# Patient Record
Sex: Female | Born: 1965 | Race: Asian | Hispanic: No | Marital: Married | Smoking: Never smoker
Health system: Southern US, Community
[De-identification: ages and names within clinical notes are randomized; demographics above are authoritative.]

## PROBLEM LIST (undated history)

## (undated) DIAGNOSIS — F3289 Other specified depressive episodes: Secondary | ICD-10-CM

## (undated) DIAGNOSIS — M436 Torticollis: Secondary | ICD-10-CM

## (undated) DIAGNOSIS — E538 Deficiency of other specified B group vitamins: Secondary | ICD-10-CM

## (undated) DIAGNOSIS — R7989 Other specified abnormal findings of blood chemistry: Secondary | ICD-10-CM

## (undated) DIAGNOSIS — M25559 Pain in unspecified hip: Secondary | ICD-10-CM

## (undated) DIAGNOSIS — E785 Hyperlipidemia, unspecified: Secondary | ICD-10-CM

## (undated) DIAGNOSIS — F329 Major depressive disorder, single episode, unspecified: Secondary | ICD-10-CM

## (undated) DIAGNOSIS — B351 Tinea unguium: Secondary | ICD-10-CM

## (undated) HISTORY — PX: APPENDECTOMY: SHX54

## (undated) HISTORY — PX: BREAST LUMPECTOMY: SHX2

## (undated) HISTORY — DX: Other specified depressive episodes: F32.89

## (undated) HISTORY — DX: Other specified abnormal findings of blood chemistry: R79.89

## (undated) HISTORY — DX: Tinea unguium: B35.1

## (undated) HISTORY — DX: Major depressive disorder, single episode, unspecified: F32.9

## (undated) HISTORY — DX: Deficiency of other specified B group vitamins: E53.8

## (undated) HISTORY — DX: Torticollis: M43.6

## (undated) HISTORY — DX: Pain in unspecified hip: M25.559

## (undated) HISTORY — DX: Hyperlipidemia, unspecified: E78.5

---

## 1997-11-14 ENCOUNTER — Other Ambulatory Visit: Admission: RE | Admit: 1997-11-14 | Discharge: 1997-11-14 | Payer: Self-pay | Admitting: Gynecology

## 1997-12-07 ENCOUNTER — Inpatient Hospital Stay (HOSPITAL_COMMUNITY): Admission: AD | Admit: 1997-12-07 | Discharge: 1997-12-07 | Payer: Self-pay | Admitting: Gynecology

## 1997-12-12 ENCOUNTER — Inpatient Hospital Stay (HOSPITAL_COMMUNITY): Admission: AD | Admit: 1997-12-12 | Discharge: 1997-12-14 | Payer: Self-pay | Admitting: Gynecology

## 1997-12-16 ENCOUNTER — Inpatient Hospital Stay (HOSPITAL_COMMUNITY): Admission: AD | Admit: 1997-12-16 | Discharge: 1997-12-18 | Payer: Self-pay | Admitting: Obstetrics and Gynecology

## 1997-12-18 ENCOUNTER — Encounter: Admission: RE | Admit: 1997-12-18 | Discharge: 1998-03-18 | Payer: Self-pay | Admitting: Obstetrics and Gynecology

## 1998-02-17 ENCOUNTER — Other Ambulatory Visit: Admission: RE | Admit: 1998-02-17 | Discharge: 1998-02-17 | Payer: Self-pay | Admitting: Gynecology

## 2000-10-04 ENCOUNTER — Ambulatory Visit (HOSPITAL_BASED_OUTPATIENT_CLINIC_OR_DEPARTMENT_OTHER): Admission: RE | Admit: 2000-10-04 | Discharge: 2000-10-04 | Payer: Self-pay | Admitting: General Surgery

## 2000-10-04 ENCOUNTER — Encounter (INDEPENDENT_AMBULATORY_CARE_PROVIDER_SITE_OTHER): Payer: Self-pay | Admitting: *Deleted

## 2003-11-06 ENCOUNTER — Other Ambulatory Visit: Admission: RE | Admit: 2003-11-06 | Discharge: 2003-11-06 | Payer: Self-pay | Admitting: Family Medicine

## 2007-03-08 ENCOUNTER — Other Ambulatory Visit: Admission: RE | Admit: 2007-03-08 | Discharge: 2007-03-08 | Payer: Self-pay | Admitting: Family Medicine

## 2007-09-27 ENCOUNTER — Ambulatory Visit (HOSPITAL_BASED_OUTPATIENT_CLINIC_OR_DEPARTMENT_OTHER): Admission: RE | Admit: 2007-09-27 | Discharge: 2007-09-27 | Payer: Self-pay | Admitting: Orthopedic Surgery

## 2010-08-13 ENCOUNTER — Other Ambulatory Visit
Admission: RE | Admit: 2010-08-13 | Discharge: 2010-08-13 | Payer: Self-pay | Source: Home / Self Care | Admitting: Family Medicine

## 2010-08-29 ENCOUNTER — Encounter: Payer: Self-pay | Admitting: Family Medicine

## 2010-12-21 NOTE — Op Note (Signed)
Carolyn Kirk, Carolyn Kirk               ACCOUNT NO.:  [id number]   MEDICAL RECORD NO.:  [id number]          PATIENT TYPE:  AMB   LOCATION:  DSC                          FACILITY:  [facility]   PHYSICIAN:  Katy Fitch. Sypher, M.D. DATE OF BIRTH:  [birth year]   DATE OF PROCEDURE:  09/27/2007  DATE OF DISCHARGE:                               OPERATIVE REPORT   PREOPERATIVE DIAGNOSIS:  A 73-month history of painful adhesive  capsulitis right shoulder, unresponsive to therapy, antiinflammatory  medication, steroid injection, with magnetic resonance imaging evidence  of thickened rotator interval, and swollen capsule.   POSTOPERATIVE DIAGNOSIS:  A 60-month history of painful adhesive  capsulitis right shoulder, unresponsive to therapy, antiinflammatory  medication, steroid injection, with magnetic resonance imaging evidence  of thickened rotator interval, and swollen capsule.   OPERATIONS:  1. Examination of right shoulder under anesthesia, confirming      significant adhesive capsulitis, followed by gentle manipulation      and improvement of range of motion from combined elevation 140-175,      external rotation at 90 degrees abduction of 60-90 degrees,      internal rotation of 10 degrees at 90 degrees, abduction to 60      degrees.  2. Diagnostic arthroscopy right shoulder, with arthroscopic      debridement of granulation tissue, followed by capsulectomy and      tenolysis of subscapularis tendon.  3. Arthroscopic subacromial decompression, with debridement of      fibrotic bursal adhesions and electrocautery of granulation tissue.   OPERATING SURGEON:  Katy Fitch. Sypher, M.D.   ASSISTANT:  Marveen Reeks. Dasnoit PA-C.   ANESTHESIA:  General by endotracheal technique.  No interscalene block  was placed preoperatively.   SUPERVISING ANESTHESIOLOGIST:  Quita Skye. Krista Blue, M.D.   INDICATIONS:  Carolyn Kirk is a 45 year old employee of [employer].  She presented for evaluation of a chronic arm  and shoulder  pain predicament.  She was initially evaluated and managed by my  associate, Dr. Merlyn Lot.  Her elbow pain improved with therapy.  Her  shoulder pain persisted.  Despite nearly 6 months of therapy and  antiinflammatory medications, she has had persistent pain, difficulty  sleeping at night, work impairment, and daily activity impairment.  Dr.  Merlyn Lot referred her for an upper extremity orthopedic shoulder consult.   Clinical examination confirmed significant adhesive capsulitis.  Her  plain films and MRI were reviewed.  Her plain films were essentially  normal.  The MRI revealed a swollen rotator interval, thickening of the  capsule, no sign of biceps pathology, significant labral pathology, or  rotator cuff impairment.   We advised [name] to consider a more aggressive approach, including  manipulation of the shoulder under anesthesia, followed by debridement  of granulation tissue, capsulectomy, and electrocautery.  After informed  consent, she is brought to the operating room at this time.   Preoperatively, she was advised by Dr. Krista Blue in an anesthesia consult  to consider an interscalene block.  She chose to defer the block.   We will begin immediate active range of motion excises in  the  postoperative period and anticipate that she will return for supervised  therapy with in 24 hours.   Questions were invited and answered in detail in the holding area.   PROCEDURE:  Carolyn Kirk was brought to the operating room and placed  in a supine position upon the operating table.  Following induction of  general anesthesia by endotracheal technique under Dr. Robina Ade direct  supervision, she was carefully positioned in the beach-chair position  with aid of a torso and head holders designed for shoulder arthroscopy.  Examination of the shoulder under anesthesia confirmed significant  adhesive capsulitis that was improved by gentle manipulation to the  aforementioned  parameters.  The right arm was then prepped with Betadine  soap and solution and sterilely draped.  Pneumatic compression devices  were applied to her cast for deep vein thrombosis prophylaxis.   After application of impervious arthroscopy drapes, the scope was placed  through a standard posterior portal utilizing a preliminary switching  stick from an anterior approach technique.  The shoulder was  instrumented without difficulty, followed by clearing of clot and  debris.  Photographic documentation of the adhesive capsulitis  granulation tissue was accomplished, followed by use of a 4.5 mm suction  shaver to debride all visible granulations from the interior of the  joint, followed by use of a bipolar cautery device to electrocauterized  the feeding vessels.  The subscapularis was invested in dense scar,  which was meticulously cleared and electrocauterized.  The anterior  capsule was somewhat adherent to the anterior surface of the  subscapularis.  This was lysed with a Psychologist, occupational.  After completion of the intraarticular debridement, the  rotator cuff was inspected and found be intact.  The biceps origin was  stable.  The biceps tendon was normal through the rotator interval, and  all rotator cuff insertions were noted to be normal.  Inferior capsule  was inflamed but otherwise normal.  The scope was removed from the  glenohumeral joint and placed in the subacromial space.  We were  challenged to visualize the subacromial space due to dense adhesions.  We ultimately instrumented through a lateral approach and began shaving  posteriorly.  After completion of a thorough bursectomy, the anatomy of  the coracoacromial arch was evaluated.  [facility] joint was normal.  The  coracoacromial ligament was normal.  This was lysed from dense bursal  adhesions.  The rotator cuff was tenolysed from the superior portion of  the subscapularis all the way across the dorsum of  the supraspinatus and  infraspinatus towards the teres minor.  Hemostasis was achieved with the  bipolar cautery.  Thereafter, the subacromial space was infiltrated with  2% lidocaine, followed by infiltration of the portals with 2% lidocaine.  No intraarticular local anesthetic was placed.   Ms. Nease was awakened from general anesthesia and transferred to the  recovery room with stable vital signs.  She will be discharged to the  care of her husband, with prescriptions for Dilaudid 2 mg 1-2 tablets  p.o. q.4-6 h. p.r.n. pain, 30 tablets, without refill; also Motrin 600  mg 1 p.o. q.6 h. p.r.n. pain, 30 tablets, with 1 refill; and Keflex 500  mg 1 p.o. q.8 h. x4 days as a prophylactic antibiotic.      Katy Fitch Sypher, M.D.  Electronically Signed     RVS/MEDQ  D:  09/27/2007  T:  09/28/2007  Job:  [id number]

## 2010-12-24 NOTE — Op Note (Signed)
[facility]  Patient:    HENCHY, MCCAULEY                        MRN: [id number] Proc. Date: 10/04/00 Attending:  Anselm Pancoast. Zachery Dakins, M.D.                           Operative Report  PREOPERATIVE DIAGNOSIS:  Left breast mass.  POSTOPERATIVE DIAGNOSIS:  Left breast mass.  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  SURGEON:  Anselm Pancoast. Zachery Dakins, M.D.  ANESTHESIA:  General.  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  She has no family history of breast cancer.  She has had one previous pregnancy and on physical exam with deep palpation, you could feel a little thickening right at approximately the areolar edge approximately 3 oclock position. I confirmed this with the ultrasound in our office and this was the area of question.  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  She wanted the area excised and therefore we plan that procedure today.  DESCRIPTION OF PROCEDURE:  The patient was taken to an operating suite.  She was given a gram of Kefzol, induction of general anesthesia and LMA tube.  The breasts were prepped with Betadine solution and draped in a sterile manner.  I thought I could probably feel the area.  The ultrasound showed this about 3 cm down into the breast and a long areolar incision was made from approximately the 2 to 4 oclock position and then with a little incision about an inch in depth your finger then you could feel the underlying mass that was firm right where we were making the incision.  This area then dissected further and I placed a 4-0 Vicryl stitch in the actual lump with pulled it up and then  the surrounding breast tissue was separated with electrocautery.  The mass is little over 1 cm in size is quite firm and was sent for frozen section examination after it was excised.  Bleeders were controlled with cautery.  The little defect was closed with a couple of 4-0 Vicryl stitches in the true deep breast tissue and a couple in the subdermal tissue.  The incision was closed with benzoin and Steri-Strips and a little light dressing was applied.  The patient was awakened and sent to the recovery room in stable postoperative condition and we will await path examination.  The patient will be released after a short stay and return to the office in approximately a week. DD:  10/04/00 TD:  10/04/00 Job: [id number]

## 2011-05-02 LAB — POCT HEMOGLOBIN-HEMACUE: Hemoglobin: 12.4

## 2013-09-16 ENCOUNTER — Other Ambulatory Visit (HOSPITAL_COMMUNITY)
Admission: RE | Admit: 2013-09-16 | Discharge: 2013-09-16 | Disposition: A | Discharge status: Home / Self Care | Payer: BC Managed Care – PPO | Source: Ambulatory Visit | Attending: Family Medicine | Admitting: Family Medicine

## 2013-09-16 ENCOUNTER — Other Ambulatory Visit: Payer: Self-pay | Admitting: Family Medicine

## 2013-09-16 DIAGNOSIS — Z124 Encounter for screening for malignant neoplasm of cervix: Secondary | ICD-10-CM | POA: Insufficient documentation

## 2013-09-16 DIAGNOSIS — Z1151 Encounter for screening for human papillomavirus (HPV): Secondary | ICD-10-CM | POA: Insufficient documentation

## 2013-10-08 ENCOUNTER — Encounter: Payer: Self-pay | Admitting: *Deleted

## 2013-10-09 ENCOUNTER — Ambulatory Visit: Payer: Self-pay | Admitting: Cardiology

## 2013-10-17 ENCOUNTER — Ambulatory Visit (INDEPENDENT_AMBULATORY_CARE_PROVIDER_SITE_OTHER): Payer: BC Managed Care – PPO | Admitting: Cardiology

## 2013-10-17 ENCOUNTER — Encounter: Payer: Self-pay | Admitting: Cardiology

## 2013-10-17 VITALS — BP 114/58 | HR 68 | Ht 63.5 in | Wt 151.0 lb

## 2013-10-17 DIAGNOSIS — E785 Hyperlipidemia, unspecified: Secondary | ICD-10-CM | POA: Insufficient documentation

## 2013-10-17 DIAGNOSIS — R079 Chest pain, unspecified: Secondary | ICD-10-CM

## 2013-10-17 MED ORDER — NITROGLYCERIN 0.4 MG SL SUBL: 0.4000 mg | SUBLINGUAL_TABLET | SUBLINGUAL | Status: DC | PRN

## 2013-10-17 NOTE — Progress Notes (Signed)
Patient ID: Carolyn Kirk, female   DOB: 1966-06-11, 48 y.o.   MRN: [id number]    Patient Name: Carolyn Kirk Date of Encounter: 10/17/2013  Primary Care Provider:  No primary provider on file. Primary Cardiologist:  Dorothy Spark  Problem List   Past Medical History  Diagnosis Date  . Other and unspecified hyperlipidemia   . Low vitamin B12 level   . Onychomycosis     of great toenail  . Torticollis, unspecified   . Pain in joint, pelvic region and thigh   . Depressive disorder, not elsewhere classified    Past Surgical History  Procedure Laterality Date  . Breast lumpectomy Left   . Appendectomy      Allergies  No Known Allergies  HPI  Pleasant 48 year old female who is coming with concerns of chest pain. 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 There retrosternal, they feel like elephant sitting on her chest, irradiated to her back and are associated with emotional stress rather than exercise activity. It can last up to 30 minutes. Beer sometimes so severe that she has to pull over when a car while she's driving.  She states that she exercises for certain minutes on a treadmill daily and she doesn't experience any chest pain or shortness of breath. There is no associated dizziness, diaphoresis or shortness of breath. She is advised fairly active and denies dyspnea on exertion. She also denies orthopnea, paroxysmal nocturnal dyspnea lower extremity edema.  The patient has been followed for hyperlipidemia by her primary care physician and she is currently on simvastatin 40 mg tablets that she takes every other day. She states that her cholesterol has been well controlled. She has very significant family history of coronary artery disease her mother died of heart attack and sudden cardiac death at age of 22, this was preceded by strokes. She also had atrial fibrillation. Her 80 year old brother was just diagnosed with atrial fibrillation. Multiple times  also suffered from stroke at a very young age.  Home Medications  Prior to Admission medications   Medication Sig Start Date End Date Taking? Authorizing Provider  CALCIUM PO Take by mouth.    Historical Provider, MD  Cholecalciferol (VITAMIN D PO) Take by mouth.    Historical Provider, MD  simvastatin (ZOCOR) 40 MG tablet Take 40 mg by mouth daily.    Historical Provider, MD    Family History  Family History  Problem Relation Age of Onset  . CVA Mother     3x  . Testicular cancer Father   . Esophageal cancer Father   . Diabetes Father   . Hyperlipidemia Brother   . Diabetes Mellitus II Sister   . Thyroid disease Sister     Social History  History   Social History  . Marital Status: Married    Spouse Name: N/A    Number of Children: N/A  . Years of Education: N/A   Occupational History  . Not on file.   Social History Main Topics  . Smoking status: Never Smoker   . Smokeless tobacco: Never Used  . Alcohol Use: No  . Drug Use: No  . Sexual Activity: Yes   Other Topics Concern  . Not on file   Social History Narrative  . No narrative on file     Review of Systems, as per HPI, otherwise negative General:  No chills, fever, night sweats or weight changes.  Cardiovascular:  No chest pain, dyspnea on exertion, edema, orthopnea,  palpitations, paroxysmal nocturnal dyspnea. Dermatological: No rash, lesions/masses Respiratory: No cough, dyspnea Urologic: No hematuria, dysuria Abdominal:   No nausea, vomiting, diarrhea, bright red blood per rectum, melena, or hematemesis Neurologic:  No visual changes, wkns, changes in mental status. All other systems reviewed and are otherwise negative except as noted above.  Physical Exam  Blood pressure 114/58, pulse 68, height 5' 3.5" (1.613 m), weight 151 lb (68.493 kg).  General: Pleasant, NAD Psych: Normal affect. Neuro: Alert and oriented X 3. Moves all extremities spontaneously. HEENT: Normal  Neck: Supple without  bruits or JVD. Lungs:  Resp regular and unlabored, CTA. Heart: RRR no s3, s4, or murmurs. Abdomen: Soft, non-tender, non-distended, BS + x 4.  Extremities: No clubbing, cyanosis or edema. DP/PT/Radials 2+ and equal bilaterally.  Labs: Creatinine 0.63 AST 13 ALT 10 Normal been met LDL 101 HDL 61 Triglycerides 196  Accessory Clinical Findings  Echocardiogram - none  ECG - normal sinus rhythm, negative T waves in leads 2, 3 aVF, V3 and V4 5.   Assessment & Plan  48 year old female with a history of hyperlipidemia and significant family history of coronary artery disease  1. Chest pain - typical in current therapy nonexertional, related to emotional stress. She had an episode of chest pain in her office today where she had negative T waves in inferolateral leads supposed to normal EKG at her doctor's office. We'll order an exercise nuclear stress test to evaluate for ischemia. If this comes back negative we'll consider coronary CT to evaluate for Prinzmetal angina or myocardial bridging. We'll prescribe her sublingual nitroglycerin when necessary.  2. Hyperlipidemia by her primary care physician currently on simvastatin 40 mg every other day  3. Blood pressure - well controlled  Follow up in 1 month.  Dorothy Spark, MD, [facility] 10/17/2013, 2:43 PM

## 2013-10-17 NOTE — Patient Instructions (Signed)
Your physician has requested that you have en exercise stress myoview. For further information please visit [email or web address]. Please follow instruction sheet, as given.  Your physician has recommended you make the following change in your medication:   1. Start Nitroglycerin as needed for Chest Pain.  Your physician recommends that you schedule a follow-up appointment after stress test.   Nitroglycerin sublingual tablets What is this medicine? NITROGLYCERIN (nye troe GLI ser in) is a type of vasodilator. It relaxes blood vessels, increasing the blood and oxygen supply to your heart. This medicine is used to relieve chest pain caused by angina. It is also used to prevent chest pain before activities like climbing stairs, going outdoors in cold weather, or sexual activity. This medicine may be used for other purposes; ask your health care provider or pharmacist if you have questions. COMMON BRAND NAME(S): Nitroquick, Nitrostat, Nitrotab What should I tell my health care provider before I take this medicine? They need to know if you have any of these conditions: -anemia -head injury, recent stroke, or bleeding in the brain -liver disease -previous heart attack -an unusual or allergic reaction to nitroglycerin, other medicines, foods, dyes, or preservatives -pregnant or trying to get pregnant -breast-feeding How should I use this medicine? Take this medicine by mouth as needed. At the first sign of an angina attack (chest pain or tightness) place one tablet under your tongue. You can also take this medicine 5 to 10 minutes before an event likely to produce chest pain. Follow the directions on the prescription label. Let the tablet dissolve under the tongue. Do not swallow whole. Replace the dose if you accidentally swallow it. It will help if your mouth is not dry. Saliva around the tablet will help it to dissolve more quickly. Do not eat or drink, smoke or chew tobacco while a tablet is  dissolving. If you are not better within 5 minutes after taking ONE dose of nitroglycerin, call 9-1-1 immediately to seek emergency medical care. Do not take more than 3 nitroglycerin tablets over 15 minutes. If you take this medicine often to relieve symptoms of angina, your doctor or health care professional may provide you with different instructions to manage your symptoms. If symptoms do not go away after following these instructions, it is important to call 9-1-1 immediately. Do not take more than 3 nitroglycerin tablets over 15 minutes. Talk to your pediatrician regarding the use of this medicine in children. Special care may be needed. Overdosage: If you think you have taken too much of this medicine contact a poison control center or emergency room at once. NOTE: This medicine is only for you. Do not share this medicine with others. What if I miss a dose? This does not apply. This medicine is only used as needed. What may interact with this medicine? Do not take this medicine with any of the following medications: -certain migraine medicines like ergotamine and dihydroergotamine (DHE) -medicines used to treat erectile dysfunction like sildenafil, tadalafil, and vardenafil -riociguat This medicine may also interact with the following medications: -alteplase -aspirin -heparin -medicines for high blood pressure -medicines for mental depression -other medicines used to treat angina -phenothiazines like chlorpromazine, mesoridazine, prochlorperazine, thioridazine This list may not describe all possible interactions. Give your health care provider a list of all the medicines, herbs, non-prescription drugs, or dietary supplements you use. Also tell them if you smoke, drink alcohol, or use illegal drugs. Some items may interact with your medicine. What should I watch for while using this  medicine? Tell your doctor or health care professional if you feel your medicine is no longer  working. Keep this medicine with you at all times. Sit or lie down when you take your medicine to prevent falling if you feel dizzy or faint after using it. Try to remain calm. This will help you to feel better faster. If you feel dizzy, take several deep breaths and lie down with your feet propped up, or bend forward with your head resting between your knees. You may get drowsy or dizzy. Do not drive, use machinery, or do anything that needs mental alertness until you know how this drug affects you. Do not stand or sit up quickly, especially if you are an older patient. This reduces the risk of dizzy or fainting spells. Alcohol can make you more drowsy and dizzy. Avoid alcoholic drinks. Do not treat yourself for coughs, colds, or pain while you are taking this medicine without asking your doctor or health care professional for advice. Some ingredients may increase your blood pressure. What side effects may I notice from receiving this medicine? Side effects that you should report to your doctor or health care professional as soon as possible: -blurred vision -dry mouth -skin rash -sweating -the feeling of extreme pressure in the head -unusually weak or tired Side effects that usually do not require medical attention (report to your doctor or health care professional if they continue or are bothersome): -flushing of the face or neck -headache -irregular heartbeat, palpitations -nausea, vomiting This list may not describe all possible side effects. Call your doctor for medical advice about side effects. You may report side effects to FDA at 1-800-FDA-1088. Where should I keep my medicine? Keep out of the reach of children. Store at room temperature between 20 and 25 degrees C (68 and 77 degrees F). Store in Retail buyer. Protect from light and moisture. Keep tightly closed. Throw away any unused medicine after the expiration date. NOTE: This sheet is a summary. It may not cover all possible  information. If you have questions about this medicine, talk to your doctor, pharmacist, or health care provider.  2014, [employer] Standard. (2013-05-16 10:27:26)

## 2013-10-28 ENCOUNTER — Telehealth: Payer: Self-pay | Admitting: Cardiology

## 2013-10-28 NOTE — Telephone Encounter (Signed)
New message  Patient took nitro pill last Monday for chest pain, She is having side effects and would like to discuss. Please call and advise.

## 2013-10-28 NOTE — Telephone Encounter (Signed)
The pt states that she took 1 NTG tablet last Monday and that she has had a headache off and on and chest pain or heartburn that she states is worse when she eats. She is scheduled to have a myoview to evaluate for ischemia on 3/26. I went over NTG instructions with the pt and advised her to take NTG up to 3 times waiting 5 mins between each pill and that if she continues to have CP 5 mins after taking the third pill to call 911 or have someone drive her to the ER, she verbalized understanding.

## 2013-10-31 ENCOUNTER — Ambulatory Visit (HOSPITAL_COMMUNITY): Payer: BC Managed Care – PPO | Attending: Internal Medicine | Admitting: Radiology

## 2013-10-31 VITALS — BP 124/75 | HR 58 | Ht 64.0 in | Wt 150.0 lb

## 2013-10-31 DIAGNOSIS — R079 Chest pain, unspecified: Secondary | ICD-10-CM

## 2013-10-31 MED ORDER — TECHNETIUM TC 99M SESTAMIBI GENERIC - CARDIOLITE
11.0000 | Freq: Once | INTRAVENOUS | Status: AC | PRN
Start: 2013-10-31 — End: 2013-10-31
  Administered 2013-10-31: 11 via INTRAVENOUS

## 2013-10-31 MED ORDER — TECHNETIUM TC 99M SESTAMIBI GENERIC - CARDIOLITE
33.0000 | Freq: Once | INTRAVENOUS | Status: AC | PRN
  Administered 2013-10-31: 33 via INTRAVENOUS

## 2013-10-31 NOTE — Progress Notes (Signed)
[facility] SITE 3 NUCLEAR MED [address] [place], [place] [address] [phone]    Cardiology Nuclear Med Study  Carolyn LoanBibha Kirk is a 48 y.o. female     MRN : [id number]     DOB: 08/01/1966  Procedure Date: 10/31/2013  Nuclear Med Background Indication for Stress Test:  Evaluation for Ischemia History:  No known CAD Cardiac Risk Factors: Family History - CAD and Lipids  Symptoms:  Chest Pain   Nuclear Pre-Procedure Caffeine/Decaff Intake:  None NPO After: 7:00pm   Lungs:  clear O2 Sat: 96% on room air. IV 0.9% NS with Angio Cath:  22g  IV Site: R Antecubital  IV Started by:  Bonnita LevanJackie Smith, RN  Chest Size (in):  36 Cup Size: C  Height: 5\' 4"  (1.626 m)  Weight:  150 lb (68.04 kg)  BMI:  Body mass index is 25.73 kg/(m^2). Tech Comments:  N/A    Nuclear Med Study 1 or 2 day study: 1 day  Stress Test Type:  Stress  Reading MD: N/A  Order Authorizing Provider:  Tobias AlexanderKatarina Nelson, MD  Resting Radionuclide: Technetium 4714m Sestamibi  Resting Radionuclide Dose: 11.0 mCi   Stress Radionuclide:  Technetium 2414m Sestamibi  Stress Radionuclide Dose: 33.0 mCi           Stress Protocol Rest HR: 58 Stress HR: 173  Rest BP: 124/75 Stress BP: 175/80  Exercise Time (min): 8:30 METS: 10.1           Dose of Adenosine (mg):  n/a Dose of Lexiscan: n/a mg  Dose of Atropine (mg): n/a Dose of Dobutamine: n/a mcg/kg/min (at max HR)  Stress Test Technologist: Nelson ChimesSharon Brooks, BS-ES  Nuclear Technologist:  Doyne Keelonya Yount, CNMT     Rest Procedure:  Myocardial perfusion imaging was performed at rest 45 minutes following the intravenous administration of Technetium 814m Sestamibi. Rest ECG: NSR with non-specific ST-T wave changes  Stress Procedure:  The patient exercised on the treadmill utilizing the Bruce Protocol for 8:30 minutes. The patient stopped due to fatigue and denied any chest pain.  Technetium 414m Sestamibi was injected at peak exercise and myocardial perfusion imaging was  performed after a brief delay. Stress ECG: No significant change from baseline ECG  QPS Raw Data Images:  Normal; no motion artifact; normal heart/lung ratio. Stress Images:  Normal homogeneous uptake in all areas of the myocardium. Rest Images:  Normal homogeneous uptake in all areas of the myocardium. Subtraction (SDS):  No evidence of ischemia. Transient Ischemic Dilatation (Normal <1.22):  0.94 Lung/Heart Ratio (Normal <0.45):  0.36  Quantitative Gated Spect Images QGS EDV:  55 ml QGS ESV:  12 ml  Impression Exercise Capacity:  Good exercise capacity. BP Response:  Normal blood pressure response. Clinical Symptoms:  No symptoms. ECG Impression:  No significant ST segment change suggestive of ischemia. Comparison with Prior Nuclear Study: No previous nuclear study performed  Overall Impression:  Normal stress nuclear study.  LV Ejection Fraction: 79%.  LV Wall Motion:  NL LV Function; NL Wall Motion   Limited Brandshomas Vashon Riordan

## 2013-11-04 ENCOUNTER — Telehealth: Payer: Self-pay | Admitting: *Deleted

## 2013-11-04 NOTE — Telephone Encounter (Signed)
Message copied by Lendon KaWILSON, Katielynn Horan on Mon Nov 04, 2013  5:53 PM ------      Message from: Lars MassonNELSON, KATARINA H      Created: Sun Nov 03, 2013  1:30 PM       Normal stress test, would you let her know?      If she continues to have chest pain please schedule a follow up appointment,      Thank you,      K ------

## 2013-11-04 NOTE — Telephone Encounter (Signed)
Pt made aware of results by phone. Scheduled her for f/u visit with Dr. Delton SeeNelson.  She reports more frequent episodes of chest pain, lasting 2 or more hours in duration, radiating to left/mid back and left hand.  She also reports "constant heartburn, which is worse when eating any food at all".  She has not tried an H2 blockers or ppi.  Will get zantac and take once daily to see if helpful.

## 2013-11-04 NOTE — Telephone Encounter (Signed)
Could you advise her to take pantoprazole 20 mg po daily? Thank you, Tobias AlexanderKatarina Lilymarie Kirk

## 2013-11-05 MED ORDER — PANTOPRAZOLE SODIUM 20 MG PO TBEC: 20.0000 mg | DELAYED_RELEASE_TABLET | Freq: Every day | ORAL | Status: DC

## 2013-11-05 NOTE — Telephone Encounter (Signed)
lmtcb home and mobile

## 2013-11-05 NOTE — Telephone Encounter (Signed)
Advised of Dr. Lindaann SloughNelson's recommendation to take pantoprazole 20 mg daily. She will pick up today.

## 2013-11-11 ENCOUNTER — Ambulatory Visit (INDEPENDENT_AMBULATORY_CARE_PROVIDER_SITE_OTHER): Payer: BC Managed Care – PPO | Admitting: Cardiology

## 2013-11-11 ENCOUNTER — Encounter: Payer: Self-pay | Admitting: Cardiology

## 2013-11-11 VITALS — BP 102/62 | HR 63 | Ht 63.5 in | Wt 150.8 lb

## 2013-11-11 DIAGNOSIS — R079 Chest pain, unspecified: Secondary | ICD-10-CM

## 2013-11-11 DIAGNOSIS — K219 Gastro-esophageal reflux disease without esophagitis: Secondary | ICD-10-CM

## 2013-11-11 DIAGNOSIS — E785 Hyperlipidemia, unspecified: Secondary | ICD-10-CM

## 2013-11-11 NOTE — Progress Notes (Signed)
Patient ID: Carolyn Kirk, female   DOB: [birth year], 48 y.o.   MRN: [id number]    Patient Name: Carolyn Kirk Date of Encounter: 11/11/2013  Primary Care Provider:  Gavin Pound, MD Primary Cardiologist:  Dorothy Spark  Problem List   Past Medical History  Diagnosis Date  . Other and unspecified hyperlipidemia   . Low vitamin B12 level   . Onychomycosis     of great toenail  . Torticollis, unspecified   . Pain in joint, pelvic region and thigh   . Depressive disorder, not elsewhere classified    Past Surgical History  Procedure Laterality Date  . Breast lumpectomy Left   . Appendectomy      Allergies  No Known Allergies  HPI  Pleasant 48 year old female who is coming with concerns of chest pain. Dose is started about a year ago and are typical in character. There retrosternal, they feel like elephant sitting on her chest, irradiated to her back and are associated with emotional stress rather than exercise activity. It can last up to 30 minutes. Beer sometimes so severe that she has to pull over when a car while she's driving.  She states that she exercises for certain minutes on a treadmill daily and she doesn't experience any chest pain or shortness of breath. There is no associated dizziness, diaphoresis or shortness of breath. She is advised fairly active and denies dyspnea on exertion. She also denies orthopnea, paroxysmal nocturnal dyspnea lower extremity edema.  The patient has been followed for hyperlipidemia by her primary care physician and she is currently on simvastatin 40 mg tablets that she takes every other day. She states that her cholesterol has been well controlled. She has very significant family history of coronary artery disease her mother died of heart attack and sudden cardiac death at age of 71, this was preceded by strokes. She also had atrial fibrillation. Her 72 year old brother was just diagnosed with atrial fibrillation. Multiple times also suffered  from stroke at a very young age.  The patient is coming after 1 month. She states that her symptoms of heart burn resolved after initiation of pantoprazole. She still has occassional sharp left sided chest pain not associated with any other symptoms.   Home Medications  Prior to Admission medications   Medication Sig Start Date End Date Taking? Authorizing Provider  CALCIUM PO Take by mouth.    Historical Provider, MD  Cholecalciferol (VITAMIN D PO) Take by mouth.    Historical Provider, MD  simvastatin (ZOCOR) 40 MG tablet Take 40 mg by mouth daily.    Historical Provider, MD    Family History  Family History  Problem Relation Age of Onset  . CVA Mother     3x  . Testicular cancer Father   . Esophageal cancer Father   . Diabetes Father   . Hyperlipidemia Brother   . Diabetes Mellitus II Sister   . Thyroid disease Sister     Social History  History   Social History  . Marital Status: Married    Spouse Name: N/A    Number of Children: N/A  . Years of Education: N/A   Occupational History  . Not on file.   Social History Main Topics  . Smoking status: Never Smoker   . Smokeless tobacco: Never Used  . Alcohol Use: No  . Drug Use: No  . Sexual Activity: Yes   Other Topics Concern  . Not on file   Social History Narrative  . No narrative  on file     Review of Systems, as per HPI, otherwise negative General:  No chills, fever, night sweats or weight changes.  Cardiovascular:  No chest pain, dyspnea on exertion, edema, orthopnea, palpitations, paroxysmal nocturnal dyspnea. Dermatological: No rash, lesions/masses Respiratory: No cough, dyspnea Urologic: No hematuria, dysuria Abdominal:   No nausea, vomiting, diarrhea, bright red blood per rectum, melena, or hematemesis Neurologic:  No visual changes, wkns, changes in mental status. All other systems reviewed and are otherwise negative except as noted above.  Physical Exam  Blood pressure 102/62, pulse 63,  height 5' 3.5" (1.613 m), weight 150 lb 12.8 oz (68.402 kg).  General: Pleasant, NAD Psych: Normal affect. Neuro: Alert and oriented X 3. Moves all extremities spontaneously. HEENT: Normal  Neck: Supple without bruits or JVD. Lungs:  Resp regular and unlabored, CTA. Heart: RRR no s3, s4, or murmurs. Abdomen: Soft, non-tender, non-distended, BS + x 4.  Extremities: No clubbing, cyanosis or edema. DP/PT/Radials 2+ and equal bilaterally.  Labs: Creatinine 0.63 AST 13 ALT 10 Normal been met LDL 101 HDL 61 Triglycerides 196  Accessory Clinical Findings  Echocardiogram - none  ECG - normal sinus rhythm, negative T waves in leads 2, 3 aVF, V3 and V4 5.  Exercise nuclear stress test: 11/01/2013 Impression  Exercise Capacity: Good exercise capacity.  BP Response: Normal blood pressure response.  Clinical Symptoms: No symptoms.  ECG Impression: No significant ST segment change suggestive of ischemia.  Comparison with Prior Nuclear Study: No previous nuclear study performed  Overall Impression: Normal stress nuclear study.  LV Ejection Fraction: 79%. LV Wall Motion: NL LV Function; NL Wall Motion    Assessment & Plan  47-year-old female with a history of hyperlipidemia and significant family history of coronary artery disease  1. Chest pain - an exercise nuclear stress test showed good exercise capacity, normal blood pressure response to stress, and no scar or ischemia.  The patient has very significant family history of premature coronary artery disease, we discussed the possibility of performing coronary calcium score or  coronary CT. Since her symptoms significantly improved with pantoprazole and she has very occasional atypical left-sided sharp chest pain we will wait for now if any of her symptoms worsens we will consider coronary CT.  Continue moderate dose of statin.   2. Hyperlipidemia by her primary care physician currently on simvastatin 40 mg every other day  3. Blood  pressure - well controlled  4. GERD - improved significantly on pantoprazole   Follow up in 1 year.  NELSON, KATARINA H, MD, FACC 11/11/2013, 8:38 AM     

## 2014-09-23 ENCOUNTER — Other Ambulatory Visit: Payer: Self-pay | Admitting: Family Medicine

## 2014-09-23 DIAGNOSIS — Z8639 Personal history of other endocrine, nutritional and metabolic disease: Secondary | ICD-10-CM

## 2014-10-01 ENCOUNTER — Ambulatory Visit
Admission: RE | Admit: 2014-10-01 | Discharge: 2014-10-01 | Disposition: A | Discharge status: Home / Self Care | Payer: BLUE CROSS/BLUE SHIELD | Source: Ambulatory Visit | Attending: Family Medicine | Admitting: Family Medicine

## 2014-10-01 ENCOUNTER — Other Ambulatory Visit: Payer: Self-pay | Admitting: Family Medicine

## 2014-10-01 ENCOUNTER — Other Ambulatory Visit: Payer: Self-pay

## 2014-10-01 DIAGNOSIS — Z8639 Personal history of other endocrine, nutritional and metabolic disease: Secondary | ICD-10-CM

## 2014-10-01 DIAGNOSIS — M25512 Pain in left shoulder: Secondary | ICD-10-CM

## 2015-06-08 ENCOUNTER — Telehealth: Payer: Self-pay | Admitting: Cardiology

## 2015-06-08 MED ORDER — PANTOPRAZOLE SODIUM 20 MG PO TBEC: 20.0000 mg | DELAYED_RELEASE_TABLET | Freq: Every day | ORAL | Status: DC

## 2015-06-08 NOTE — Telephone Encounter (Addendum)
Pt c/o mild, persistent chest discomfort for 4-5 days. One night, the pain was severe (like the pain she experienced when she saw Dr. Delton SeeNelson in March 2015), but she rested and it decreased on its own. She st it is a mild, constant pain that is nagging, but not enough she can't function normally. She has not taken any Nitro.  She has no other symptoms. She has no VS to report.  Patient st she is not taking pantoprazole - she st she doesn't think she ever started it. Per Dr. Delton SeeNelson, patient to START PANTOPRAZOLE 20 mg daily and keep OV scheduled Wednesday, November 2. Patient agrees with treatment plan.

## 2015-06-08 NOTE — Telephone Encounter (Signed)
Pt c/o of Chest Pain: STAT if CP now or developed within 24 hours  1. Are you having CP right now? Mild but yes  2. Are you experiencing any other symptoms (ex. SOB, nausea, vomiting, sweating)? No   3. How long have you been experiencing CP? Started Friday 10/28  4. Is your CP continuous or coming and going? Mostly has stayed  5. Have you taken Nitroglycerin? no ?

## 2015-06-10 ENCOUNTER — Ambulatory Visit (INDEPENDENT_AMBULATORY_CARE_PROVIDER_SITE_OTHER): Payer: BLUE CROSS/BLUE SHIELD | Admitting: Cardiology

## 2015-06-10 ENCOUNTER — Encounter: Payer: Self-pay | Admitting: Cardiology

## 2015-06-10 VITALS — BP 110/82 | HR 63 | Ht 63.5 in | Wt 150.6 lb

## 2015-06-10 DIAGNOSIS — E785 Hyperlipidemia, unspecified: Secondary | ICD-10-CM | POA: Diagnosis not present

## 2015-06-10 DIAGNOSIS — R072 Precordial pain: Secondary | ICD-10-CM | POA: Diagnosis not present

## 2015-06-10 DIAGNOSIS — Z8249 Family history of ischemic heart disease and other diseases of the circulatory system: Secondary | ICD-10-CM | POA: Diagnosis not present

## 2015-06-10 DIAGNOSIS — I209 Angina pectoris, unspecified: Secondary | ICD-10-CM

## 2015-06-10 DIAGNOSIS — R9431 Abnormal electrocardiogram [ECG] [EKG]: Secondary | ICD-10-CM

## 2015-06-10 LAB — CBC WITH DIFFERENTIAL/PLATELET
Basophils Absolute: 0.1 10*3/uL (ref 0.0–0.1)
Basophils Relative: 1 % (ref 0–1)
Eosinophils Absolute: 0.4 10*3/uL (ref 0.0–0.7)
Eosinophils Relative: 4 % (ref 0–5)
HCT: 37.2 % (ref 36.0–46.0)
Hemoglobin: 12.2 g/dL (ref 12.0–15.0)
Lymphocytes Relative: 36 % (ref 12–46)
Lymphs Abs: 3.6 10*3/uL (ref 0.7–4.0)
MCH: 28 pg (ref 26.0–34.0)
MCHC: 32.8 g/dL (ref 30.0–36.0)
MCV: 85.3 fL (ref 78.0–100.0)
MPV: 10.5 fL (ref 8.6–12.4)
Monocytes Absolute: 0.7 10*3/uL (ref 0.1–1.0)
Monocytes Relative: 7 % (ref 3–12)
Neutro Abs: 5.2 10*3/uL (ref 1.7–7.7)
Neutrophils Relative %: 52 % (ref 43–77)
Platelets: 356 10*3/uL (ref 150–400)
RBC: 4.36 MIL/uL (ref 3.87–5.11)
RDW: 13.6 % (ref 11.5–15.5)
WBC: 10 10*3/uL (ref 4.0–10.5)

## 2015-06-10 LAB — TSH: TSH: 0.933 u[IU]/mL (ref 0.350–4.500)

## 2015-06-10 LAB — COMPREHENSIVE METABOLIC PANEL
ALT: 9 U/L (ref 6–29)
AST: 11 U/L (ref 10–35)
Albumin: 3.9 g/dL (ref 3.6–5.1)
Alkaline Phosphatase: 71 U/L (ref 33–130)
BUN: 9 mg/dL (ref 7–25)
CO2: 26 mmol/L (ref 20–31)
Calcium: 8.8 mg/dL (ref 8.6–10.4)
Chloride: 105 mmol/L (ref 98–110)
Creat: 0.58 mg/dL (ref 0.50–1.05)
Glucose, Bld: 82 mg/dL (ref 65–99)
Potassium: 4.3 mmol/L (ref 3.5–5.3)
Sodium: 140 mmol/L (ref 135–146)
Total Bilirubin: 0.4 mg/dL (ref 0.2–1.2)
Total Protein: 6.9 g/dL (ref 6.1–8.1)

## 2015-06-10 LAB — TROPONIN I: Troponin I: <0.01 ng/mL (ref <0.06)

## 2015-06-10 NOTE — Patient Instructions (Signed)
Medication Instructions:   Your physician recommends that you continue on your current medications as directed. Please refer to the Current Medication list given to you today.    Labwork:  TODAY---CMET, CBC W DIFF, TSH, TROPONIN, AND NMR WITH LIPIDS     Testing/Procedures:  Your physician has requested that you have cardiac CT. Cardiac computed tomography (CT) is a painless test that uses an x-ray machine to take clear, detailed pictures of your heart. For further information please visit [email or web address]. Please follow instruction sheet as given.  PLEASE SCHEDULE THIS ON DR NELSON'S READER DAY     Follow-Up:  AT DR NELSON'S VERY NEXT AVAILABLE APPOINTMENT       If you need a refill on your cardiac medications before your next appointment, please call your pharmacy.

## 2015-06-10 NOTE — Progress Notes (Signed)
Patient ID: Carolyn Kirk, female   DOB: September 02, 1965, 49 y.o.   MRN: [id number]     Patient Name: Carolyn Kirk Date of Encounter: 06/10/2015  Primary Care Provider:  No PCP Per Patient Primary Cardiologist:  Dorothy Spark  Problem List   Past Medical History  Diagnosis Date  . Other and unspecified hyperlipidemia   . Low vitamin B12 level   . Onychomycosis     of great toenail  . Torticollis, unspecified   . Pain in joint, pelvic region and thigh   . Depressive disorder, not elsewhere classified    Past Surgical History  Procedure Laterality Date  . Breast lumpectomy Left   . Appendectomy      Allergies  No Known Allergies  HPI  Pleasant 49 year old female who is coming with concerns of chest pain. Dose is started about a year ago and are typical in character. There retrosternal, they feel like elephant sitting on her chest, irradiated to her back and are associated with emotional stress rather than exercise activity. It can last up to 30 minutes. Beer sometimes so severe that she has to pull over when a car while she's driving.  She states that she exercises for certain minutes on a treadmill daily and she doesn't experience any chest pain or shortness of breath. There is no associated dizziness, diaphoresis or shortness of breath. She is advised fairly active and denies dyspnea on exertion. She also denies orthopnea, paroxysmal nocturnal dyspnea lower extremity edema.  The patient has been followed for hyperlipidemia by her primary care physician and she is currently on simvastatin 40 mg tablets that she takes every other day. She states that her cholesterol has been well controlled. She has very significant family history of coronary artery disease her mother died of heart attack and sudden cardiac death at age of 82, this was preceded by strokes. She also had atrial fibrillation. Her 58 year old brother was just diagnosed with atrial fibrillation. Multiple times also  suffered from stroke at a very young age.  06/10/2015 - The patient is coming after 1 year. She has been experiencing on and off chest pain that got worse in the last week and is ongoing. She feels SOB on exertion. No response to Pantoprazole.  No palpitations or syncope.    Home Medications  Prior to Admission medications   Medication Sig Start Date End Date Taking? Authorizing Provider  CALCIUM PO Take by mouth.    Historical Provider, MD  Cholecalciferol (VITAMIN D PO) Take by mouth.    Historical Provider, MD  simvastatin (ZOCOR) 40 MG tablet Take 40 mg by mouth daily.    Historical Provider, MD    Family History  Family History  Problem Relation Age of Onset  . CVA Mother     3x  . Testicular cancer Father   . Esophageal cancer Father   . Diabetes Father   . Hyperlipidemia Brother   . Diabetes Mellitus II Sister   . Thyroid disease Sister     Social History  Social History   Social History  . Marital Status: Married    Spouse Name: N/A  . Number of Children: N/A  . Years of Education: N/A   Occupational History  . Not on file.   Social History Main Topics  . Smoking status: Never Smoker   . Smokeless tobacco: Never Used  . Alcohol Use: No  . Drug Use: No  . Sexual Activity: Yes   Other Topics Concern  . Not on  file   Social History Narrative     Review of Systems, as per HPI, otherwise negative General:  No chills, fever, night sweats or weight changes.  Cardiovascular:  No chest pain, dyspnea on exertion, edema, orthopnea, palpitations, paroxysmal nocturnal dyspnea. Dermatological: No rash, lesions/masses Respiratory: No cough, dyspnea Urologic: No hematuria, dysuria Abdominal:   No nausea, vomiting, diarrhea, bright red blood per rectum, melena, or hematemesis Neurologic:  No visual changes, wkns, changes in mental status. All other systems reviewed and are otherwise negative except as noted above.  Physical Exam  Height 5' 3.5" (1.613 m).    General: Pleasant, NAD Psych: Normal affect. Neuro: Alert and oriented X 3. Moves all extremities spontaneously. HEENT: Normal  Neck: Supple without bruits or JVD. Lungs:  Resp regular and unlabored, CTA. Heart: RRR no s3, s4, or murmurs. Abdomen: Soft, non-tender, non-distended, BS + x 4.  Extremities: No clubbing, cyanosis or edema. DP/PT/Radials 2+ and equal bilaterally.  Labs: Creatinine 0.63 AST 13 ALT 10 Normal been met LDL 101 HDL 61 Triglycerides 196  Accessory Clinical Findings  Echocardiogram - none  ECG - normal sinus rhythm, negative T waves in leads 2, 3 aVF, V3 and V4 5.  Exercise nuclear stress test: 11/01/2013 Impression  Exercise Capacity: Good exercise capacity.  BP Response: Normal blood pressure response.  Clinical Symptoms: No symptoms.  ECG Impression: No significant ST segment change suggestive of ischemia.  Comparison with Prior Nuclear Study: No previous nuclear study performed  Overall Impression: Normal stress nuclear study.  LV Ejection Fraction: 79%. LV Wall Motion: NL LV Function; NL Wall Motion   ECG: SR, negative T waves in the anterolateral, inferior ledas suggestive of ischemia, anterior changes are new   Assessment & Plan  49 year old female with a history of hyperlipidemia and significant family history of coronary artery disease  1. Chest pain - an exercise nuclear stress test showed good exercise capacity, normal blood pressure response to stress, and no scar or ischemia.  The patient has very significant family history of premature coronary artery disease, and worsening chest pain with new ECG changes suggestive of ischemia. We discussed a coronary cath or coronary CTA, she would prefer a non-invasive coronary CTA.  We will draw labs today and schedule ASAP.  Continue moderate dose of statin.   2. Hyperlipidemia - on simvastatin 40 mg every other day, check lipids today - NMR  3. Blood pressure - well controlled  4. GERD -  improved significantly on pantoprazole   Follow up in 1 year.  Dorothy Spark, MD, [facility] 06/10/2015, 10:23 AM

## 2015-06-14 LAB — CARDIO IQ(R) ADVANCED LIPID PANEL
Apolipoprotein B: 101 mg/dL (ref 49–103)
Cholesterol, Total: 210 mg/dL — ABNORMAL HIGH (ref 125–200)
Cholesterol/HDL Ratio: 3.2 calc (ref <=5.0)
HDL Cholesterol: 65 mg/dL (ref >=46)
LDL Large: 5302 nmol/L (ref 5038–17886)
LDL Medium: 371 nmol/L (ref 121–397)
LDL Particle Number: 1487 nmol/L (ref 1016–2185)
LDL Peak Size: 213.8 Angstrom — ABNORMAL LOW (ref >=218.2)
LDL Small: 316 nmol/L (ref 115–386)
LDL, Calculated: 109 mg/dL
Lipoprotein (a): 37 nmol/L (ref <75)
Non-HDL Cholesterol: 145 mg/dL
Triglycerides: 179 mg/dL — ABNORMAL HIGH

## 2015-06-22 ENCOUNTER — Telehealth: Payer: Self-pay | Admitting: Cardiology

## 2015-06-22 NOTE — Telephone Encounter (Signed)
06-15-15  Spoke with patient and she is aware that the Insurance denied the Cardiac Ct.  Morrie Sheldonshley has sent  Dr. Mayford Knifeurner the denial.  Patient is waiting on Dr. Mayford Knifeurner to let her know what the next step is.

## 2015-06-22 NOTE — Telephone Encounter (Signed)
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  According to [employer] in billing/pre-cert, she is going to resubmit this pts claim and the need for the pt to have a cardiac ct vs. Cath, for her stress test was negative and she has on-going cardiac complaints which need further investigation.  Will continue to follow-up with billing and the pt once the final decision has been made.

## 2015-06-23 NOTE — Telephone Encounter (Signed)
Appeal for Cardiac CTA [facility] [employer].

## 2015-06-30 ENCOUNTER — Telehealth: Payer: Self-pay | Admitting: Cardiology

## 2015-06-30 NOTE — Telephone Encounter (Signed)
Pt calling to inform Dr Delton SeeNelson that her insurance company is requiring her to fill out additional paperwork in regards to having her appeal for approval of her cardiac cta done.  Pt states she will fax this paperwork to our office today, vs bringing it into the office, for its easier on commuting.  Informed the pt that she should fax this paperwork to [phone] attention Dr Delton SeeNelson and Lajoyce CornersIvy. Informed the pt that I will have Dr Delton SeeNelson fill this paperwork out accordingly, and call her back once this is completed.  Pt request that I mail this information back to her confirmed mailing address once I call her back when its completed.  Informed the pt that this would be no problem at all.  Pt verbalized understanding and agrees with this plan.

## 2015-06-30 NOTE — Telephone Encounter (Signed)
New message     Talk to the nurse regarding an appeal on her cardiac cat scan.  She heard back from the ins company but has questions

## 2015-07-01 NOTE — Telephone Encounter (Signed)
Notified the pt that Dr Delton SeeNelson filled her papers out for provider appeal for the pt to get approved for her coronary cta.  Pt request that this information be mailed to her confirmed mailing address.  Informed the pt that I will mail this out to her today, and she should contact us back with any additional information needed for this appeal.  Pt verbalized understanding, agrees with this plan, and gracious for all the assistance provided.

## 2015-07-07 ENCOUNTER — Telehealth: Payer: Self-pay | Admitting: Cardiology

## 2015-07-07 NOTE — Telephone Encounter (Signed)
New Message    Pt calling stating that her insurance company faxed our office a consent form for pt's CT that was ordered by Dr. Delton SeeNelson. The form needed to be filled out and mailed back to the pt so she can do her portion and send it to her insurance company. Pt states she still hasn't received it. Please call back and advise.

## 2015-07-07 NOTE — Telephone Encounter (Signed)
Informed the pt that her papers were sent in the mail last Tuesday, and not through certified mail, for she told me that she has 40 days to have these papers completed and mailed to her.  Informed the pt that given last week was a Holiday, it will take a few extra days before she will receive her paperwork to her mailing address.  Informed the pt that she should be receiving her papers sometime this week.  Pt verbalized understanding and agrees with this plan.  Pt just wanted to make sure it was already sent to her mailing address.

## 2015-08-13 ENCOUNTER — Telehealth: Payer: Self-pay | Admitting: *Deleted

## 2015-08-13 NOTE — Telephone Encounter (Signed)
Contacted the pt to inform her that we were unable to get her cardiac ct approved, after multiple attempts, and Dr Delton SeeNelson recommends that if she is still experiencing chest pain on exertion, then we should consider scheduling a left cardiac cath.  Per the pt she is opposed to having a cardiac cath set up today, for she would like to attempt to call her insurance company tomorrow, to see if its possible at all to be reconsidered for a cardiac ct.  Pt prefers the least invasive testing as possible.  Informed the pt that would be completely fine, and I will update Dr Delton SeeNelson on her decision at this time.  Pt verbalized understanding and agrees with this plan.

## 2015-08-13 NOTE — Telephone Encounter (Signed)
----- Message from Lars Masson, MD sent at 08/13/2015 10:21 AM EST ----- Regarding: FW: Cardiac CT on day nelson is reader and OV appt for nelson next avail.   Jonathen Rathman, Her CT is not going to be approved, please ask if she continues to have exertional chest pain, if yes, please schedule a left cardiac cath. Thank you, Aris Lot  ----- Message -----    From: Britt Bolognese    Sent: 08/12/2015   4:02 PM      To: Lars Masson, MD, Loa Socks, LPN Subject: RE: Cardiac CT on day nelson is reader and O#  First Level Appeal was also denied.  I will give [name] a copy of paperwork. ----- Message -----    From: Lars Masson, MD    Sent: 07/21/2015  10:41 AM      To: Britt Bolognese Subject: RE: Cardiac CT on day nelson is reader and O#  Thank you  ----- Message -----    From: Britt Bolognese    Sent: 07/21/2015   9:37 AM      To: Lars Masson, MD, Loa Socks, LPN Subject: FW: Cardiac CT on day nelson is reader and O#  Dr. Delton See  Received phone call today from [email or web address] [phone]).  They have received our appeal and are sending it out for external review.  We should receive an answer NLT 07-28-15 ----- Message -----    From: Britt Bolognese    Sent: 07/20/2015      To: Britt Bolognese Subject: FW: Cardiac CT on day nelson is reader and O#    ----- Message -----    From: Britt Bolognese    Sent: 07/14/2015      To: Britt Bolognese Subject: FW: Cardiac CT on day nelson is reader and O#    ----- Message -----    From: Britt Bolognese    Sent: 07/13/2015      To: Britt Bolognese Subject: FW: Cardiac CT on day nelson is reader and O#    ----- Message -----    From: Britt Bolognese    Sent: 07/08/2015      To: Britt Bolognese Subject: FW: Cardiac CT on day nelson is reader and O#    ----- Message -----    From: Britt Bolognese    Sent: 07/06/2015      To: Britt Bolognese Subject: FW: Cardiac CT on day nelson is reader and  O#    ----- Message -----    From: Britt Bolognese    Sent: 06/29/2015      To: Britt Bolognese Subject: FW: Cardiac CT on day nelson is reader and O#    ----- Message -----    From: Britt Bolognese    Sent: 06/23/2015      To: Britt Bolognese Subject: FW: Cardiac CT on day nelson is reader and O#    ----- Message -----    From: Lars Masson, MD    Sent: 06/18/2015   3:33 PM      To: Loa Socks, LPN, Charmaine Judeth Cornfield Subject: RE: Cardiac CT on day nelson is reader and O#  The suspicion is high despite negative stress test, our options are a left cardiac cath or cardiac CT.  ----- Message -----    From: Britt Bolognese    Sent: 06/17/2015   2:44 PM      To:  Lars MassonKatarina H Nelson, MD, Loa SocksIvy M Valary Manahan, LPN Subject: Annell GreeningFW: Cardiac CT on day nelson is reader and O#  Dr. Delton SeeNelson  Cardiac CTA denied d/t:   (pt has CP) CCTA is used when recent stress testing (MPI or stress echo) was unclear.    Please advise.  Thanks  Charmaine  ----- Message -----    From: Clarita LeberSonya T Pegram    Sent: 06/10/2015  11:12 AM      To: Pricilla HolmSharon B Ferguson, Cv Div [facility] Pre Cert/Auth Subject: Cardiac CT on day nelson is reader and OV ap#

## 2015-08-14 ENCOUNTER — Telehealth: Payer: Self-pay | Admitting: Cardiology

## 2015-08-14 NOTE — Telephone Encounter (Signed)
F/u  Pt following up on previous note concerning carduiac ct. Please call back and discuss.

## 2015-08-14 NOTE — Telephone Encounter (Signed)
Pt calling to inform Dr Delton SeeNelson and our billing representative Charmaine, that [facility], to be reviewed again for coverage.  Pt states that [employer] told her that the reason this study has not been approved yet, is because there was not "standard code" provided when this test was ordered.  Pt states that [employer] will be sending us another letter of appeal for both Dr Delton SeeNelson and billing to review and call them back with the appropriate "standard code" to have this submitted for approval.  Pt states that if after that is complete and if the test is still denied, then she will possibly consider proceeding with having a cath done.  Informed the pt that I will route this message to Dr Delton SeeNelson and Suan Halterharmaine as an fyi.  Pt verbalized understanding and agrees with this plan.

## 2015-08-18 NOTE — Telephone Encounter (Signed)
F/u  Pt following up on information she received from insurance company- wanted to forward information- pt requested to speak w/ RN- pt stated to please call back after 4pm today. Please call back and discuss.

## 2015-08-18 NOTE — Telephone Encounter (Signed)
Pt states that she [facility] what is necessary for her to meet coverage for her coronary ct, Dr Delton SeeNelson ordered.  Pt asking for an appropriate fax number to send this information too, for Dr Delton SeeNelson to review.  Pt states she will fax this tomorrow.  Advised the pt to fax this information to [phone] attention:  Lajoyce Cornersvy and Dr Delton SeeNelson Pt verbalized understanding and gracious for all the assistance provided.

## 2015-08-19 NOTE — Telephone Encounter (Signed)
Pts paperwork received and given to Dr Delton SeeNelson for further review and recommendation on 2nd appeal needed for cardiac ct.  Dr Delton SeeNelson will be working on this with Billing rep Charmaine, for 2nd appeal approval.

## 2015-10-15 ENCOUNTER — Telehealth: Payer: Self-pay | Admitting: *Deleted

## 2015-10-15 NOTE — Telephone Encounter (Signed)
Spoke with the pt about Dr Delton SeeNelson talking to her PCP Dr Zachery DauerBarnes this morning.  Informed the pt tha Dr Zachery DauerBarnes and Dr Delton SeeNelson discussed her complaints of progressively worsening exertional chest pain, with unchanged EKG, and denied cardiac ct.  Informed the pt that she will need to be set up for a cardiac cath for worsening symptoms and significant family hx of premature CAD.  Informed the pt that per Dr Delton SeeNelson, being its been since November of 2016 since we saw her, she will need to come in to see one of our Extenders for work-up and discussion for cath.  Scheduled the pt to come in and see Norma FredricksonLori Gerhardt NP on Monday 10/19/15 at 2pm.  Pt requested this day and time frame.  Pt verbalized understanding and agrees with this plan.  Will route this message to Dr Delton SeeNelson to make her aware of this appt date and time.

## 2015-10-15 NOTE — Telephone Encounter (Signed)
-----   Message from Lars MassonKatarina H Nelson, MD sent at 10/15/2015  1:07 PM EST ----- Lajoyce CornersIvy,  I was called by patient's PCP that she was at her office and was complaining of progressively worsening exertional chest pain. She had negative stress test in 2015 and her coronary CT was denied by her insurance company. With her worsening symptoms and significant family history of premature CAD we will schedule a cardiac catheterization.  Aris LotKatarina

## 2015-10-15 NOTE — Telephone Encounter (Signed)
See telephone note from 10/15/15 for new plan for this pt per Dr Delton SeeNelson and her PCP Dr Zachery DauerBarnes.

## 2015-10-19 ENCOUNTER — Ambulatory Visit (INDEPENDENT_AMBULATORY_CARE_PROVIDER_SITE_OTHER): Payer: BLUE CROSS/BLUE SHIELD | Admitting: Nurse Practitioner

## 2015-10-19 ENCOUNTER — Ambulatory Visit
Admission: RE | Admit: 2015-10-19 | Discharge: 2015-10-19 | Disposition: A | Discharge status: Home / Self Care | Payer: BLUE CROSS/BLUE SHIELD | Source: Ambulatory Visit | Attending: Nurse Practitioner | Admitting: Nurse Practitioner

## 2015-10-19 ENCOUNTER — Encounter: Payer: Self-pay | Admitting: Nurse Practitioner

## 2015-10-19 ENCOUNTER — Other Ambulatory Visit: Payer: Self-pay | Admitting: Nurse Practitioner

## 2015-10-19 VITALS — BP 116/70 | HR 62 | Ht 64.0 in | Wt 154.2 lb

## 2015-10-19 DIAGNOSIS — Z01810 Encounter for preprocedural cardiovascular examination: Secondary | ICD-10-CM | POA: Diagnosis not present

## 2015-10-19 LAB — CBC
HCT: 38.9 % (ref 36.0–46.0)
Hemoglobin: 12.7 g/dL (ref 12.0–15.0)
MCH: 27.7 pg (ref 26.0–34.0)
MCHC: 32.6 g/dL (ref 30.0–36.0)
MCV: 84.7 fL (ref 78.0–100.0)
MPV: 10.6 fL (ref 8.6–12.4)
Platelets: 351 10*3/uL (ref 150–400)
RBC: 4.59 MIL/uL (ref 3.87–5.11)
RDW: 13.4 % (ref 11.5–15.5)
WBC: 10 10*3/uL (ref 4.0–10.5)

## 2015-10-19 LAB — BASIC METABOLIC PANEL
BUN: 10 mg/dL (ref 7–25)
CO2: 27 mmol/L (ref 20–31)
Calcium: 9.1 mg/dL (ref 8.6–10.4)
Chloride: 105 mmol/L (ref 98–110)
Creat: 0.56 mg/dL (ref 0.50–1.05)
Glucose, Bld: 96 mg/dL (ref 65–99)
Potassium: 4.6 mmol/L (ref 3.5–5.3)
Sodium: 140 mmol/L (ref 135–146)

## 2015-10-19 NOTE — Progress Notes (Signed)
CARDIOLOGY OFFICE NOTE  Date:  10/19/2015    Carolyn Kirk Date of Birth: [birth year] Medical Record #[id number]  PCP:  Gaye Alken, MD  Cardiologist:  Delton See    Chief Complaint  Patient presents with  . Chest Pain  . Hyperlipidemia    Pre cath visit - seen for Dr. Delton See    History of Present Illness: Carolyn Kirk is a 50 y.o. female who presents today for a pre cath visit. Seen for Dr. Delton See.   She has a history of strong FH for CAD, HLD and ongoing chest pain. EKG abnormal.  Negative Myoview from 10/2013.   Seen here back in November - continued to endorse chest pain. Preferred noninvasive approach and coronary CT ordered - insurance will not cover. Now to proceed on with cardiac cath.   Comes in today. Here with her husband. Her symptoms of chest pain continues to persist. This has been chronic. She describes chest pressure/squeezing. Sometimes stabby like pain. Radiates to the back. No other associated symptoms. She does have some shoulder pain - not sure if it is from her heart or "just shoulder pain". She is not sure if it happens every day. Exertional related over the past 2 weeks - has had to stop going to the gym. Saw PCP last week - apparently EKG unchanged. Now ready to proceed on with cardiac cath. She has had no response with NTG use or PPI therapy.  Past Medical History  Diagnosis Date  . Other and unspecified hyperlipidemia   . Low vitamin B12 level   . Onychomycosis     of great toenail  . Torticollis, unspecified   . Pain in joint, pelvic region and thigh   . Depressive disorder, not elsewhere classified     Past Surgical History  Procedure Laterality Date  . Breast lumpectomy Left   . Appendectomy       Medications: Current Outpatient Prescriptions  Medication Sig Dispense Refill  . nitroGLYCERIN (NITROSTAT) 0.4 MG SL tablet Place 1 tablet (0.4 mg total) under the tongue every 5 (five) minutes as needed for chest pain. 25 tablet 3    . simvastatin (ZOCOR) 40 MG tablet Take 40 mg by mouth every other day.      No current facility-administered medications for this visit.    Allergies: No Known Allergies  Social History: The patient  reports that she has never smoked. She has never used smokeless tobacco. She reports that she does not drink alcohol or use illicit drugs.   Family History: The patient's family history includes CVA in her mother; Diabetes in her father; Diabetes Mellitus II in her sister; Esophageal cancer in her father; Hyperlipidemia in her brother; Testicular cancer in her father; Thyroid disease in her sister. Mother died with heart attack/sudden death and had had prior stroke.   Review of Systems: Please see the history of present illness.   Otherwise, the review of systems is positive for none.   All other systems are reviewed and negative.   Physical Exam: VS:  BP 116/70 mmHg  Pulse 62  Ht  (1.626 m)  Wt 154 lb 3.2 oz (69.945 kg)  BMI 26.46 kg/m2 .  BMI Body mass index is 26.46 kg/(m^2).  Wt Readings from Last 3 Encounters:  10/19/15 154 lb 3.2 oz (69.945 kg)  06/10/15 150 lb 9.6 oz (68.312 kg)  11/11/13 150 lb 12.8 oz (68.402 kg)    General: Pleasant. Well developed, well nourished and in no acute distress.  HEENT: Normal. Neck: Supple, no JVD, carotid bruits, or masses noted.  Cardiac: Regular rate and rhythm. No murmurs, rubs, or gallops. No edema.  Respiratory:  Lungs are clear to auscultation bilaterally with normal work of breathing.  GI: Soft and nontender.  MS: No deformity or atrophy. Gait and ROM intact. Skin: Warm and dry. Color is normal.  Neuro:  Strength and sensation are intact and no gross focal deficits noted.  Psych: Alert, appropriate and with normal affect.   LABORATORY DATA:  EKG:  EKG is not ordered today.  Lab Results  Component Value Date   WBC 10.0 06/10/2015   HGB 12.2 06/10/2015   HCT 37.2 06/10/2015   PLT 356 06/10/2015   GLUCOSE 82  06/10/2015   CHOL 210* 06/10/2015   TRIG 179* 06/10/2015   HDL 65 06/10/2015   LDLCALC 109 06/10/2015   ALT 9 06/10/2015   AST 11 06/10/2015   NA 140 06/10/2015   K 4.3 06/10/2015   CL 105 06/10/2015   CREATININE 0.58 06/10/2015   BUN 9 06/10/2015   CO2 26 06/10/2015   TSH 0.933 06/10/2015    BNP (last 3 results) No results for input(s): BNP in the last 8760 hours.  ProBNP (last 3 results) No results for input(s): PROBNP in the last 8760 hours.   Other Studies Reviewed Today:  Myoview Impression from 10/2013 Exercise Capacity: Good exercise capacity. BP Response: Normal blood pressure response. Clinical Symptoms: No symptoms. ECG Impression: No significant ST segment change suggestive of ischemia. Comparison with Prior Nuclear Study: No previous nuclear study performed  Overall Impression: Normal stress nuclear study.  LV Ejection Fraction: 79%. LV Wall Motion: NL LV Function; NL Wall Motion   Cassell Clementhomas Brackbill   Assessment/Plan: 1. Chest pain - worrisome symptoms - abnormal EKG noted. Has HLD and strong FH for CAD - cardiac catheterization recommended. The patient understands that risks include but are not limited to stroke (1 in 1000), death (1 in 1000), kidney failure [usually temporary] (1 in 500), bleeding (1 in 200), allergic reaction [possibly serious] (1 in 200), and agrees to proceed. Scheduled for Wednesday with Dr. Katrinka BlazingSmith. Needs to limit activities in the interim.   2. HLD - on statin therapy.  Current medicines are reviewed with the patient today.  The patient does not have concerns regarding medicines other than what has been noted above.  The following changes have been made:  See above.  Labs/ tests ordered today include:    Orders Placed This Encounter  Procedures  . DG Chest 2 View  . Basic metabolic panel  . CBC  . APTT  . Protime-INR  . EKG 12-Lead     Disposition:   Further disposition pending.   Patient is agreeable to this  plan and will call if any problems develop in the interim.   Signed: Rosalio MacadamiaLori C. Lugene Hitt, RN, ANP-C 10/19/2015 2:39 PM  [facility] [address] [place], [place]  [address] Phone: [phone]) [phone] Fax: [phone]) [phone]

## 2015-10-19 NOTE — Patient Instructions (Addendum)
We will be checking the following labs today - BMET, CBC, PT, PTT  Please go to [facility] to [facility] on the first floor for a chest Xray - you may walk in.    Medication Instructions:    Continue with your current medicines.     Testing/Procedures To Be Arranged:  Cardiac catheterization       Other Special Instructions:   Limit your activities until your cardiac catheterization.   Your provider has recommended a cardiac catherization  You are scheduled for a cardiac catheterization on Wednesday, March 15 at 1PM with Dr. Katrinka Blazing or associate.  Go to [facility] 2nd Floor Short Stay on Wednesday, March 15 at 11:30 AM.  Enter thru the [facility] entrance A No food or drink after midnight on Tuesday. You may take your medications with a sip of water on the day of your procedure.   Coronary Angiogram A coronary angiogram, also called coronary angiography, is an X-ray procedure used to look at the arteries in the heart. In this procedure, a dye (contrast dye) is injected through a long, hollow tube (catheter). The catheter is about the size of a piece of cooked spaghetti and is inserted through your groin, wrist, or arm. The dye is injected into each artery, and X-rays are then taken to show if there is a blockage in the arteries of your heart.  LET [facility] CARE PROVIDER KNOW ABOUT:  Any allergies you have, including allergies to shellfish or contrast dye.   All medicines you are taking, including vitamins, herbs, eye drops, creams, and over-the-counter medicines.   Previous problems you or members of your family have had with the use of anesthetics.   Any blood disorders you have.   Previous surgeries you have had.  History of kidney problems or failure.   Other medical conditions you have.  RISKS AND COMPLICATIONS  Generally, a coronary angiogram is a safe procedure. However, about 1 person out of 1000 can have problems that may  include:  Allergic reaction to the dye.  Bleeding/bruising from the access site or other locations.  Kidney injury, especially in people with impaired kidney function.  Stroke (rare).  Heart attack (rare).  Irregular rhythms (rare)  Death (rare)  BEFORE THE PROCEDURE   Do not eat or drink anything after midnight the night before the procedure or as directed by your health care provider.   Ask your health care provider about changing or stopping your regular medicines. This is especially important if you are taking diabetes medicines or blood thinners.  PROCEDURE  You may be given a medicine to help you relax (sedative) before the procedure. This medicine is given through an intravenous (IV) access tube that is inserted into one of your veins.   The area where the catheter will be inserted will be washed and shaved. This is usually done in the groin but may be done in the fold of your arm (near your elbow) or in the wrist.   A medicine will be given to numb the area where the catheter will be inserted (local anesthetic).   The health care provider will insert the catheter into an artery. The catheter will be guided by using a special type of X-ray (fluoroscopy) of the blood vessel being examined.   A special dye will then be injected into the catheter, and X-rays will be taken. The dye will help to show where any narrowing or blockages are located in the heart arteries.  AFTER THE PROCEDURE   If the procedure is done through the leg, you will be kept in bed lying flat for several hours. You will be instructed to not bend or cross your legs.  The insertion site will be checked frequently.   The pulse in your feet or wrist will be checked frequently.   Additional blood tests, X-rays, and an electrocardiogram may be done.       If you need a refill on your cardiac medications before your next appointment, please call your pharmacy.   Call the [facility] office at [phone] if you have any questions, problems or concerns.

## 2015-10-20 ENCOUNTER — Telehealth: Payer: Self-pay | Admitting: *Deleted

## 2015-10-20 LAB — PROTIME-INR
INR: 0.96 (ref <1.50)
Prothrombin Time: 12.9 seconds (ref 11.6–15.2)

## 2015-10-20 LAB — APTT: aPTT: 28 seconds (ref 24–37)

## 2015-10-20 MED ORDER — ATORVASTATIN CALCIUM 40 MG PO TABS: 40.0000 mg | ORAL_TABLET | Freq: Every day | ORAL | Status: DC

## 2015-10-20 NOTE — Telephone Encounter (Signed)
That's perfectly fine with me, Carolyn Kirk 

## 2015-10-20 NOTE — Telephone Encounter (Signed)
Gloriann LoanPatnaik Litzy       Previous Messages     ----- Message -----   From: Loa SocksIvy M Avenly Roberge, LPN   Sent: 7/84/69623/14/2017  7:51 AM    To: Lars MassonKatarina H Nelson, MD   Not quite sure who the pt is you are talking about. No name was attached to this message. Can you help with this?   Thanks so much,   Haeley Fordham    ----- Message -----   From: Lars MassonKatarina H Nelson, MD   Sent: 10/16/2015 10:29 AM    To: Leone BrandLaura R Ingold, NP, Loa SocksIvy M Roma Bierlein, LPN   Her lipids from PCP are elevated LDL 163, TG 261, I would switch simvastatin 40 mg po daily to lipitor 40 mg po daily.       Notified the pt that per Dr Delton SeeNelson, she received the pts labs faxed to our office for further review and recommendation, and per Dr Delton SeeNelson, she wanted me to contact the pt to inform her that her LDL is elevated at 163, and TG 261, and she recommends that switch her simvastatin 40 mg to lipitor 40 mg po daily.  Confirmed the pharmacy of choice with the pt.  Pt states that she wants to finish her supply of simvastatin, before starting lipitor. Pt only has 3 weeks left on her simvastatin.  Informed the pt that would be fine and I will make Dr Delton SeeNelson aware of this.  Pt verbalized understanding and agrees with this plan.

## 2015-10-20 NOTE — Telephone Encounter (Signed)
S/w pt stated needed to be at hospital at 11:00 am for Cath and not 11:30 am. Pt stated understanding.

## 2015-10-21 ENCOUNTER — Ambulatory Visit (HOSPITAL_COMMUNITY)
Admission: RE | Admit: 2015-10-21 | Discharge: 2015-10-21 | Disposition: A | Discharge status: Home / Self Care | Payer: BLUE CROSS/BLUE SHIELD | Source: Ambulatory Visit | Attending: Interventional Cardiology | Admitting: Interventional Cardiology

## 2015-10-21 ENCOUNTER — Encounter (HOSPITAL_COMMUNITY)
Admission: RE | Disposition: A | Discharge status: Home / Self Care | Payer: Self-pay | Source: Ambulatory Visit | Attending: Interventional Cardiology

## 2015-10-21 DIAGNOSIS — I209 Angina pectoris, unspecified: Secondary | ICD-10-CM | POA: Diagnosis not present

## 2015-10-21 DIAGNOSIS — R0789 Other chest pain: Secondary | ICD-10-CM | POA: Insufficient documentation

## 2015-10-21 DIAGNOSIS — Z8249 Family history of ischemic heart disease and other diseases of the circulatory system: Secondary | ICD-10-CM

## 2015-10-21 DIAGNOSIS — E785 Hyperlipidemia, unspecified: Secondary | ICD-10-CM | POA: Diagnosis not present

## 2015-10-21 DIAGNOSIS — R9431 Abnormal electrocardiogram [ECG] [EKG]: Secondary | ICD-10-CM | POA: Insufficient documentation

## 2015-10-21 DIAGNOSIS — R079 Chest pain, unspecified: Secondary | ICD-10-CM | POA: Diagnosis present

## 2015-10-21 HISTORY — PX: CARDIAC CATHETERIZATION: SHX172

## 2015-10-21 SURGERY — LEFT HEART CATH AND CORONARY ANGIOGRAPHY

## 2015-10-21 MED ORDER — ACETAMINOPHEN 325 MG PO TABS: 650.0000 mg | ORAL_TABLET | ORAL | Status: DC | PRN

## 2015-10-21 MED ORDER — SODIUM CHLORIDE 0.9% FLUSH: 3.0000 mL | Freq: Two times a day (BID) | INTRAVENOUS | Status: DC

## 2015-10-21 MED ORDER — HEPARIN SODIUM (PORCINE) 1000 UNIT/ML IJ SOLN
INTRAMUSCULAR | Status: AC
  Filled 2015-10-21: qty 1

## 2015-10-21 MED ORDER — SODIUM CHLORIDE 0.9% FLUSH: 3.0000 mL | INTRAVENOUS | Status: DC | PRN

## 2015-10-21 MED ORDER — DIAZEPAM 5 MG PO TABS
10.0000 mg | ORAL_TABLET | ORAL | Status: AC
  Administered 2015-10-21: 10 mg via ORAL

## 2015-10-21 MED ORDER — HEPARIN (PORCINE) IN NACL 2-0.9 UNIT/ML-% IJ SOLN
INTRAMUSCULAR | Status: DC | PRN
  Administered 2015-10-21: 1000 mL

## 2015-10-21 MED ORDER — SODIUM CHLORIDE 0.9 % IV SOLN: 250.0000 mL | INTRAVENOUS | Status: DC | PRN

## 2015-10-21 MED ORDER — IOHEXOL 350 MG/ML SOLN
INTRAVENOUS | Status: DC | PRN
  Administered 2015-10-21: 50 mL via INTRAVENOUS

## 2015-10-21 MED ORDER — FENTANYL CITRATE (PF) 100 MCG/2ML IJ SOLN
INTRAMUSCULAR | Status: DC | PRN
  Administered 2015-10-21 (×2): 50 ug via INTRAVENOUS

## 2015-10-21 MED ORDER — MIDAZOLAM HCL 2 MG/2ML IJ SOLN
INTRAMUSCULAR | Status: AC
  Filled 2015-10-21: qty 2

## 2015-10-21 MED ORDER — VERAPAMIL HCL 2.5 MG/ML IV SOLN
INTRAVENOUS | Status: DC | PRN
  Administered 2015-10-21: 10 mL via INTRA_ARTERIAL

## 2015-10-21 MED ORDER — ASPIRIN 81 MG PO CHEW
CHEWABLE_TABLET | ORAL | Status: AC
  Administered 2015-10-21: 81 mg via ORAL
  Filled 2015-10-21: qty 1

## 2015-10-21 MED ORDER — ONDANSETRON HCL 4 MG/2ML IJ SOLN: 4.0000 mg | Freq: Four times a day (QID) | INTRAMUSCULAR | Status: DC | PRN

## 2015-10-21 MED ORDER — FENTANYL CITRATE (PF) 100 MCG/2ML IJ SOLN
INTRAMUSCULAR | Status: AC
Start: 2015-10-21 — End: 2015-10-21
  Filled 2015-10-21: qty 2

## 2015-10-21 MED ORDER — VERAPAMIL HCL 2.5 MG/ML IV SOLN
INTRAVENOUS | Status: AC
Start: 2015-10-21 — End: 2015-10-21
  Filled 2015-10-21: qty 2

## 2015-10-21 MED ORDER — LIDOCAINE HCL (PF) 1 % IJ SOLN
INTRAMUSCULAR | Status: DC | PRN
  Administered 2015-10-21: 2 mL

## 2015-10-21 MED ORDER — SODIUM CHLORIDE 0.9 % IV SOLN
INTRAVENOUS | Status: DC
  Administered 2015-10-21: 12:00:00 via INTRAVENOUS

## 2015-10-21 MED ORDER — HEPARIN (PORCINE) IN NACL 2-0.9 UNIT/ML-% IJ SOLN
INTRAMUSCULAR | Status: AC
  Filled 2015-10-21: qty 1000

## 2015-10-21 MED ORDER — HEPARIN SODIUM (PORCINE) 1000 UNIT/ML IJ SOLN
INTRAMUSCULAR | Status: DC | PRN
  Administered 2015-10-21: 3500 [IU] via INTRAVENOUS

## 2015-10-21 MED ORDER — DIAZEPAM 5 MG PO TABS
ORAL_TABLET | ORAL | Status: AC
  Filled 2015-10-21: qty 2

## 2015-10-21 MED ORDER — MIDAZOLAM HCL 2 MG/2ML IJ SOLN
INTRAMUSCULAR | Status: DC | PRN
  Administered 2015-10-21 (×2): 1 mg via INTRAVENOUS

## 2015-10-21 MED ORDER — LIDOCAINE HCL (PF) 1 % IJ SOLN
INTRAMUSCULAR | Status: AC
  Filled 2015-10-21: qty 30

## 2015-10-21 MED ORDER — SODIUM CHLORIDE 0.9 % WEIGHT BASED INFUSION: 3.0000 mL/kg/h | INTRAVENOUS | Status: DC

## 2015-10-21 MED ORDER — ASPIRIN 81 MG PO CHEW
81.0000 mg | CHEWABLE_TABLET | ORAL | Status: AC
  Administered 2015-10-21: 81 mg via ORAL

## 2015-10-21 MED ORDER — OXYCODONE-ACETAMINOPHEN 5-325 MG PO TABS: 1.0000 | ORAL_TABLET | ORAL | Status: DC | PRN

## 2015-10-21 SURGICAL SUPPLY — 10 items
CATH INFINITI 5 FR JL3.5 (CATHETERS) ×2 IMPLANT
CATH INFINITI JR4 5F (CATHETERS) ×2 IMPLANT
DEVICE RAD COMP TR BAND LRG (VASCULAR PRODUCTS) ×2 IMPLANT
GLIDESHEATH SLEND A-KIT 6F 22G (SHEATH) ×2 IMPLANT
KIT HEART LEFT (KITS) ×2 IMPLANT
PACK CARDIAC CATHETERIZATION (CUSTOM PROCEDURE TRAY) ×2 IMPLANT
TRANSDUCER W/STOPCOCK (MISCELLANEOUS) ×2 IMPLANT
TUBING CIL FLEX 10 FLL-RA (TUBING) ×2 IMPLANT
WIRE HI TORQ VERSACORE-J 145CM (WIRE) ×2 IMPLANT
WIRE SAFE-T 1.5MM-J .035X260CM (WIRE) ×2 IMPLANT

## 2015-10-21 NOTE — H&P (View-Only) (Signed)
   CARDIOLOGY OFFICE NOTE  Date:  10/19/2015    Carolyn Kirk Date of Birth: 08/13/1965 Medical Record #[id number]  PCP:  BARNES,ELIZABETH STEWART, MD  Cardiologist:  Nelson    Chief Complaint  Patient presents with  . Chest Pain  . Hyperlipidemia    Pre cath visit - seen for Dr. Nelson    History of Present Illness: Carolyn Kirk is a 49 y.o. female who presents today for a pre cath visit. Seen for Dr. Nelson.   She has a history of strong FH for CAD, HLD and ongoing chest pain. EKG abnormal.  Negative Myoview from 10/2013.   Seen here back in November - continued to endorse chest pain. Preferred noninvasive approach and coronary CT ordered - insurance will not cover. Now to proceed on with cardiac cath.   Comes in today. Here with her husband. Her symptoms of chest pain continues to persist. This has been chronic. She describes chest pressure/squeezing. Sometimes stabby like pain. Radiates to the back. No other associated symptoms. She does have some shoulder pain - not sure if it is from her heart or "just shoulder pain". She is not sure if it happens every day. Exertional related over the past 2 weeks - has had to stop going to the gym. Saw PCP last week - apparently EKG unchanged. Now ready to proceed on with cardiac cath. She has had no response with NTG use or PPI therapy.  Past Medical History  Diagnosis Date  . Other and unspecified hyperlipidemia   . Low vitamin B12 level   . Onychomycosis     of great toenail  . Torticollis, unspecified   . Pain in joint, pelvic region and thigh   . Depressive disorder, not elsewhere classified     Past Surgical History  Procedure Laterality Date  . Breast lumpectomy Left   . Appendectomy       Medications: Current Outpatient Prescriptions  Medication Sig Dispense Refill  . nitroGLYCERIN (NITROSTAT) 0.4 MG SL tablet Place 1 tablet (0.4 mg total) under the tongue every 5 (five) minutes as needed for chest pain. 25 tablet 3    . simvastatin (ZOCOR) 40 MG tablet Take 40 mg by mouth every other day.      No current facility-administered medications for this visit.    Allergies: No Known Allergies  Social History: The patient  reports that she has never smoked. She has never used smokeless tobacco. She reports that she does not drink alcohol or use illicit drugs.   Family History: The patient's family history includes CVA in her mother; Diabetes in her father; Diabetes Mellitus II in her sister; Esophageal cancer in her father; Hyperlipidemia in her brother; Testicular cancer in her father; Thyroid disease in her sister. Mother died with heart attack/sudden death and had had prior stroke.   Review of Systems: Please see the history of present illness.   Otherwise, the review of systems is positive for none.   All other systems are reviewed and negative.   Physical Exam: VS:  BP 116/70 mmHg  Pulse 62  Ht 5' 4" (1.626 m)  Wt 154 lb 3.2 oz (69.945 kg)  BMI 26.46 kg/m2 .  BMI Body mass index is 26.46 kg/(m^2).  Wt Readings from Last 3 Encounters:  10/19/15 154 lb 3.2 oz (69.945 kg)  06/10/15 150 lb 9.6 oz (68.312 kg)  11/11/13 150 lb 12.8 oz (68.402 kg)    General: Pleasant. Well developed, well nourished and in no acute distress.    HEENT: Normal. Neck: Supple, no JVD, carotid bruits, or masses noted.  Cardiac: Regular rate and rhythm. No murmurs, rubs, or gallops. No edema.  Respiratory:  Lungs are clear to auscultation bilaterally with normal work of breathing.  GI: Soft and nontender.  MS: No deformity or atrophy. Gait and ROM intact. Skin: Warm and dry. Color is normal.  Neuro:  Strength and sensation are intact and no gross focal deficits noted.  Psych: Alert, appropriate and with normal affect.   LABORATORY DATA:  EKG:  EKG is not ordered today.  Lab Results  Component Value Date   WBC 10.0 06/10/2015   HGB 12.2 06/10/2015   HCT 37.2 06/10/2015   PLT 356 06/10/2015   GLUCOSE 82  06/10/2015   CHOL 210* 06/10/2015   TRIG 179* 06/10/2015   HDL 65 06/10/2015   LDLCALC 109 06/10/2015   ALT 9 06/10/2015   AST 11 06/10/2015   NA 140 06/10/2015   K 4.3 06/10/2015   CL 105 06/10/2015   CREATININE 0.58 06/10/2015   BUN 9 06/10/2015   CO2 26 06/10/2015   TSH 0.933 06/10/2015    BNP (last 3 results) No results for input(s): BNP in the last 8760 hours.  ProBNP (last 3 results) No results for input(s): PROBNP in the last 8760 hours.   Other Studies Reviewed Today:  Myoview Impression from 10/2013 Exercise Capacity: Good exercise capacity. BP Response: Normal blood pressure response. Clinical Symptoms: No symptoms. ECG Impression: No significant ST segment change suggestive of ischemia. Comparison with Prior Nuclear Study: No previous nuclear study performed  Overall Impression: Normal stress nuclear study.  LV Ejection Fraction: 79%. LV Wall Motion: NL LV Function; NL Wall Motion   Thomas Brackbill   Assessment/Plan: 1. Chest pain - worrisome symptoms - abnormal EKG noted. Has HLD and strong FH for CAD - cardiac catheterization recommended. The patient understands that risks include but are not limited to stroke (1 in 1000), death (1 in 1000), kidney failure [usually temporary] (1 in 500), bleeding (1 in 200), allergic reaction [possibly serious] (1 in 200), and agrees to proceed. Scheduled for Wednesday with Dr. Smith. Needs to limit activities in the interim.   2. HLD - on statin therapy.  Current medicines are reviewed with the patient today.  The patient does not have concerns regarding medicines other than what has been noted above.  The following changes have been made:  See above.  Labs/ tests ordered today include:    Orders Placed This Encounter  Procedures  . DG Chest 2 View  . Basic metabolic panel  . CBC  . APTT  . Protime-INR  . EKG 12-Lead     Disposition:   Further disposition pending.   Patient is agreeable to this  plan and will call if any problems develop in the interim.   Signed: Shaniqua Guillot C. Frida Wahlstrom, RN, ANP-C 10/19/2015 2:39 PM  [facility] [address] [place], [place]  [address] Phone: [phone] Fax: [phone]         

## 2015-10-21 NOTE — Interval H&P Note (Signed)
Cath Lab Visit (complete for each Cath Lab visit)  Clinical Evaluation Leading to the Procedure:   ACS: No.  Non-ACS:    Anginal Classification: CCS III  Anti-ischemic medical therapy: Minimal Therapy (1 class of medications)  Non-Invasive Test Results: No non-invasive testing performed  Prior CABG: No previous CABG      History and Physical Interval Note:  10/21/2015 1:10 PM  Carolyn Kirk  has presented today for surgery, with the diagnosis of unstable angina  The various methods of treatment have been discussed with the patient and family. After consideration of risks, benefits and other options for treatment, the patient has consented to  Procedure(s): Left Heart Cath and Coronary Angiography (N/A) as a surgical intervention .  The patient's history has been reviewed, patient examined, no change in status, stable for surgery.  I have reviewed the patient's chart and labs.  Questions were answered to the patient's satisfaction.     Lesleigh NoeSMITH III,Edvin Albus W

## 2015-10-21 NOTE — Research (Signed)
[facility] Research Study Informed Consent   Subject Name: Carolyn Kirk  Subject met inclusion and exclusion criteria.  The informed consent form, study requirements and expectations were reviewed with the subject and questions and concerns were addressed prior to the signing of the consent form.  The subject verbalized understanding of the trial requirements.  The subject agreed to participate in the Elizabeth trial and signed the informed consent at 1243 on 10/21/2015.  The informed consent was obtained prior to performance of any protocol-specific procedures for the subject.  A copy of the signed informed consent was given to the subject and a copy was placed in the subject's medical record.  Blossom Hoops 10/21/2015, 2:30 PM

## 2015-10-21 NOTE — Discharge Instructions (Signed)
Radial Site Care °Refer to this sheet in the next few weeks. These instructions provide you with information about caring for yourself after your procedure. Your health care provider may also give you more specific instructions. Your treatment has been planned according to current medical practices, but problems sometimes occur. Call your health care provider if you have any problems or questions after your procedure. °WHAT TO EXPECT AFTER THE PROCEDURE °After your procedure, it is typical to have the following: °· Bruising at the radial site that usually fades within 1-2 weeks. °· Blood collecting in the tissue (hematoma) that may be painful to the touch. It should usually decrease in size and tenderness within 1-2 weeks. °HOME CARE INSTRUCTIONS °· Take medicines only as directed by your health care provider. °· You may shower 24-48 hours after the procedure or as directed by your health care provider. Remove the bandage (dressing) and gently wash the site with plain soap and water. Pat the area dry with a clean towel. Do not rub the site, because this may cause bleeding. °· Do not take baths, swim, or use a hot tub until your health care provider approves. °· Check your insertion site every day for redness, swelling, or drainage. °· Do not apply powder or lotion to the site. °· Do not flex or bend the affected arm for 24 hours or as directed by your health care provider. °· Do not push or pull heavy objects with the affected arm for 24 hours or as directed by your health care provider. °· Do not lift over 10 lb (4.5 kg) for 5 days after your procedure or as directed by your health care provider. °· Ask your health care provider when it is okay to: °¨ Return to work or school. °¨ Resume usual physical activities or sports. °¨ Resume sexual activity. °· Do not drive home if you are discharged the same day as the procedure. Have someone else drive you. °· You may drive 24 hours after the procedure unless otherwise  instructed by your health care provider. °· Do not operate machinery or power tools for 24 hours after the procedure. °· If your procedure was done as an outpatient procedure, which means that you went home the same day as your procedure, a responsible adult should be with you for the first 24 hours after you arrive home. °· Keep all follow-up visits as directed by your health care provider. This is important. °SEEK MEDICAL CARE IF: °· You have a fever. °· You have chills. °· You have increased bleeding from the radial site. Hold pressure on the site. °SEEK IMMEDIATE MEDICAL CARE IF: °· You have unusual pain at the radial site. °· You have redness, warmth, or swelling at the radial site. °· You have drainage (other than a small amount of blood on the dressing) from the radial site. °· The radial site is bleeding, and the bleeding does not stop after 30 minutes of holding steady pressure on the site. °· Your arm or hand becomes pale, cool, tingly, or numb. °  °This information is not intended to replace advice given to you by your health care provider. Make sure you discuss any questions you have with your health care provider. °  °Document Released: 08/27/2010 Document Revised: 08/15/2014 Document Reviewed: 02/10/2014 °Elsevier Interactive Patient Education ©2016 [employer] ° °

## 2015-10-22 ENCOUNTER — Encounter (HOSPITAL_COMMUNITY): Payer: Self-pay | Admitting: Interventional Cardiology

## 2015-10-26 ENCOUNTER — Telehealth: Payer: Self-pay | Admitting: Cardiology

## 2015-10-26 NOTE — Telephone Encounter (Signed)
She has normal coronaries        ----- Message -----     From: Lyn RecordsHenry W Smith, MD     Sent: 10/21/2015  2:28 PM      To: Lars MassonKatarina H Nelson, MD, Rosalio MacadamiaLori C Gerhardt, NP    Notified the pt that per Dr Katrinka BlazingSmith and Dr Delton SeeNelson, the pts cath report showed that she has normal coronaries, and her symptoms do not correlate with her heart.  Informed the pt that per Dr Delton SeeNelson, she has no restrictions, as far as activity level, and she recommends that the pt follow-up with her PCP Dr Zachery DauerBarnes for further work-up.  Dr Delton SeeNelson recommended that her PCP look into possibly referring the pt to a GI MD for symptoms and complaints, if she agrees with that recommendation.  Informed the pt that her cardiac cath report was sent to her PCP for further review.  Pt verbalized understanding and agrees with this plan.

## 2015-10-26 NOTE — Telephone Encounter (Signed)
New message      Calling to get cath results.  Is pt still under restrictions ordered by Lawson FiscalLori?

## 2016-03-03 ENCOUNTER — Other Ambulatory Visit: Payer: Self-pay

## 2016-03-03 MED ORDER — ATORVASTATIN CALCIUM 40 MG PO TABS: 40.0000 mg | ORAL_TABLET | Freq: Every day | ORAL | 2 refills | Status: DC

## 2016-11-09 ENCOUNTER — Other Ambulatory Visit: Payer: Self-pay | Admitting: Obstetrics & Gynecology

## 2016-11-09 ENCOUNTER — Other Ambulatory Visit (HOSPITAL_COMMUNITY)
Admission: RE | Admit: 2016-11-09 | Discharge: 2016-11-09 | Disposition: A | Discharge status: Home / Self Care | Payer: BLUE CROSS/BLUE SHIELD | Source: Ambulatory Visit | Attending: Obstetrics & Gynecology | Admitting: Obstetrics & Gynecology

## 2016-11-09 DIAGNOSIS — Z1151 Encounter for screening for human papillomavirus (HPV): Secondary | ICD-10-CM | POA: Diagnosis present

## 2016-11-09 DIAGNOSIS — Z01419 Encounter for gynecological examination (general) (routine) without abnormal findings: Secondary | ICD-10-CM | POA: Diagnosis present

## 2016-11-14 LAB — CYTOLOGY - PAP
DIAGNOSIS: NEGATIVE
HPV: NOT DETECTED

## 2017-02-27 ENCOUNTER — Other Ambulatory Visit: Payer: Self-pay | Admitting: Family Medicine

## 2017-02-27 DIAGNOSIS — E049 Nontoxic goiter, unspecified: Secondary | ICD-10-CM

## 2017-03-07 ENCOUNTER — Ambulatory Visit
Admission: RE | Admit: 2017-03-07 | Discharge: 2017-03-07 | Disposition: A | Discharge status: Home / Self Care | Payer: BLUE CROSS/BLUE SHIELD | Source: Ambulatory Visit | Attending: Family Medicine | Admitting: Family Medicine

## 2017-03-07 DIAGNOSIS — E049 Nontoxic goiter, unspecified: Secondary | ICD-10-CM

## 2017-08-18 DIAGNOSIS — J209 Acute bronchitis, unspecified: Secondary | ICD-10-CM | POA: Diagnosis not present

## 2017-08-28 DIAGNOSIS — E785 Hyperlipidemia, unspecified: Secondary | ICD-10-CM | POA: Diagnosis not present

## 2017-08-28 DIAGNOSIS — Z8639 Personal history of other endocrine, nutritional and metabolic disease: Secondary | ICD-10-CM | POA: Diagnosis not present

## 2018-03-02 DIAGNOSIS — E785 Hyperlipidemia, unspecified: Secondary | ICD-10-CM | POA: Diagnosis not present

## 2018-03-02 DIAGNOSIS — Z0000 Encounter for general adult medical examination without abnormal findings: Secondary | ICD-10-CM | POA: Diagnosis not present

## 2018-03-06 ENCOUNTER — Other Ambulatory Visit: Payer: Self-pay | Admitting: Family Medicine

## 2018-03-06 DIAGNOSIS — R590 Localized enlarged lymph nodes: Secondary | ICD-10-CM

## 2018-03-20 ENCOUNTER — Ambulatory Visit
Admission: RE | Admit: 2018-03-20 | Discharge: 2018-03-20 | Disposition: A | Discharge status: Home / Self Care | Payer: BLUE CROSS/BLUE SHIELD | Source: Ambulatory Visit | Attending: Family Medicine | Admitting: Family Medicine

## 2018-03-20 DIAGNOSIS — R590 Localized enlarged lymph nodes: Secondary | ICD-10-CM | POA: Diagnosis not present

## 2018-03-27 DIAGNOSIS — H5789 Other specified disorders of eye and adnexa: Secondary | ICD-10-CM | POA: Diagnosis not present

## 2018-03-27 DIAGNOSIS — H1031 Unspecified acute conjunctivitis, right eye: Secondary | ICD-10-CM | POA: Diagnosis not present

## 2018-05-17 ENCOUNTER — Ambulatory Visit (INDEPENDENT_AMBULATORY_CARE_PROVIDER_SITE_OTHER): Payer: BLUE CROSS/BLUE SHIELD | Admitting: Podiatry

## 2018-05-17 VITALS — BP 130/76 | HR 69

## 2018-05-17 DIAGNOSIS — L603 Nail dystrophy: Secondary | ICD-10-CM | POA: Diagnosis not present

## 2018-05-17 DIAGNOSIS — L608 Other nail disorders: Secondary | ICD-10-CM | POA: Diagnosis not present

## 2018-05-17 DIAGNOSIS — B351 Tinea unguium: Secondary | ICD-10-CM

## 2018-05-17 DIAGNOSIS — A499 Bacterial infection, unspecified: Secondary | ICD-10-CM | POA: Diagnosis not present

## 2018-05-17 DIAGNOSIS — M79609 Pain in unspecified limb: Secondary | ICD-10-CM

## 2018-05-17 MED ORDER — FLUCONAZOLE 150 MG PO TABS: 150.0000 mg | ORAL_TABLET | ORAL | 1 refills | Status: DC

## 2018-05-17 NOTE — Progress Notes (Signed)
  Subjective:  Patient ID: Carolyn Kirk, female    DOB: Jan 27, 1966,  MRN: [id number]  Chief Complaint  Patient presents with  . Nail Problem    Nail fungus 10 years, affecting 1st toe on both feet. Toenail is raised from nailbed. No pain or discomfort. Began after a hiking trip that injured both 1st toes. Oral lamisil has worked previously, but issue returns. Topical lamisil had no effect.    52 y.o. female presents with the above complaint.  Above history reviewed and confirmed with patient.  Unsure how long she took Lamisil for but she did have success with it previously  Review of Systems: Negative except as noted in the HPI. Denies N/V/F/Ch.  Past Medical History:  Diagnosis Date  . Depressive disorder, not elsewhere classified   . Low vitamin B12 level   . Onychomycosis    of great toenail  . Other and unspecified hyperlipidemia   . Pain in joint, pelvic region and thigh   . Torticollis, unspecified     Current Outpatient Medications:  .  atorvastatin (LIPITOR) 40 MG tablet, Take 1 tablet (40 mg total) by mouth daily., Disp: 90 tablet, Rfl: 2 .  fluconazole (DIFLUCAN) 150 MG tablet, Take 1 tablet (150 mg total) by mouth once a week., Disp: 6 tablet, Rfl: 1 .  nitroGLYCERIN (NITROSTAT) 0.4 MG SL tablet, Place 1 tablet (0.4 mg total) under the tongue every 5 (five) minutes as needed for chest pain. (Patient not taking: Reported on 05/17/2018), Disp: 25 tablet, Rfl: 3 .  ofloxacin (OCUFLOX) 0.3 % ophthalmic solution, PLACE ONE DROP IN OD QID UTD, Disp: , Rfl: 1  Social History   Tobacco Use  Smoking Status Never Smoker  Smokeless Tobacco Never Used    No Known Allergies Objective:   Vitals:   05/17/18 1539  BP: 130/76  Pulse: 69   There is no height or weight on file to calculate BMI. Constitutional Well developed. Well nourished.  Vascular Dorsalis pedis pulses palpable bilaterally. Posterior tibial pulses palpable bilaterally. Capillary refill normal to all  digits.  No cyanosis or clubbing noted. Pedal hair growth normal.  Neurologic Normal speech. Oriented to person, place, and time. Epicritic sensation to light touch grossly present bilaterally.  Dermatologic Bilateral hallux nails with thickening dystrophy subungual debris transverse ridging yellow discoloration No open wounds. No skin lesions.  Orthopedic: Normal joint ROM without pain or crepitus bilaterally. No visible deformities. No bony tenderness.   Radiographs: None Assessment:   1. Nail fungus   2. Pain due to onychomycosis of nail   3. Nail dystrophy    Plan:  Patient was evaluated and treated and all questions answered.  Onychomycosis -Educated on etiology of nail fungus. -Nails debrided and nail sample taken for microbiology and histology. -We will request baseline LFTs from PCP.  Patient states that she had them performed recently at [facility] for weekly fluconazole.  Will consider switch to Lamisil should culture show evidence of fungus that could be efficaciously treated with Lamisil  Procedure: Nail Debridement Rationale: painful dystrophic nails Type of Debridement: manual, sharp debridement. Instrumentation: Nail nipper, rotary burr. Number of Nails: 2  Return in about 5 weeks (around 06/21/2018).

## 2018-05-18 NOTE — Progress Notes (Signed)
Addendum:   Results received from [facility].  AST 11 ALT 10

## 2018-05-18 NOTE — Progress Notes (Signed)
Request faxed to Dr. Zachery Dauer office for these labs including the liver function.

## 2018-06-07 DIAGNOSIS — H04123 Dry eye syndrome of bilateral lacrimal glands: Secondary | ICD-10-CM | POA: Diagnosis not present

## 2018-06-07 DIAGNOSIS — H524 Presbyopia: Secondary | ICD-10-CM | POA: Diagnosis not present

## 2018-06-07 DIAGNOSIS — H5213 Myopia, bilateral: Secondary | ICD-10-CM | POA: Diagnosis not present

## 2018-06-21 ENCOUNTER — Ambulatory Visit: Payer: BLUE CROSS/BLUE SHIELD | Admitting: Podiatry

## 2018-06-28 ENCOUNTER — Ambulatory Visit: Payer: BLUE CROSS/BLUE SHIELD | Admitting: Podiatry

## 2018-06-28 ENCOUNTER — Telehealth: Payer: Self-pay | Admitting: Podiatry

## 2018-06-28 NOTE — Telephone Encounter (Signed)
Had to r/s patient appointment due to provider having surgery. She is r/s to 07/19/2018 @ 12:15pm. She wanted to know if she could get a refill on the toenail fungus prescription until her next visit.

## 2018-06-28 NOTE — Telephone Encounter (Signed)
I called pt and asked if she had taken the diflucan refill and she stated no. I told pt she could take that.

## 2018-07-19 ENCOUNTER — Ambulatory Visit (INDEPENDENT_AMBULATORY_CARE_PROVIDER_SITE_OTHER): Payer: BLUE CROSS/BLUE SHIELD | Admitting: Podiatry

## 2018-07-19 ENCOUNTER — Encounter: Payer: Self-pay | Admitting: Podiatry

## 2018-07-19 DIAGNOSIS — M79676 Pain in unspecified toe(s): Secondary | ICD-10-CM

## 2018-07-19 DIAGNOSIS — L603 Nail dystrophy: Secondary | ICD-10-CM | POA: Diagnosis not present

## 2018-09-02 NOTE — Progress Notes (Signed)
  Subjective:  Patient ID: Carolyn Kirk, female    DOB: 04-Feb-1966,  MRN: [id number]  Chief Complaint  Patient presents with  . Nail Problem    fungus follow up; pt stated, "think its improving; here for lab results"    53 y.o. female presents with the above complaint.  Here for culture results.  Review of Systems: Negative except as noted in the HPI. Denies N/V/F/Ch.  Past Medical History:  Diagnosis Date  . Depressive disorder, not elsewhere classified   . Low vitamin B12 level   . Onychomycosis    of great toenail  . Other and unspecified hyperlipidemia   . Pain in joint, pelvic region and thigh   . Torticollis, unspecified     Current Outpatient Medications:  .  atorvastatin (LIPITOR) 40 MG tablet, Take 1 tablet (40 mg total) by mouth daily., Disp: 90 tablet, Rfl: 2 .  fluconazole (DIFLUCAN) 150 MG tablet, Take 1 tablet (150 mg total) by mouth once a week., Disp: 6 tablet, Rfl: 1 .  nitroGLYCERIN (NITROSTAT) 0.4 MG SL tablet, Place 1 tablet (0.4 mg total) under the tongue every 5 (five) minutes as needed for chest pain., Disp: 25 tablet, Rfl: 3 .  ofloxacin (OCUFLOX) 0.3 % ophthalmic solution, PLACE ONE DROP IN OD QID UTD, Disp: , Rfl: 1  Social History   Tobacco Use  Smoking Status Never Smoker  Smokeless Tobacco Never Used    No Known Allergies Objective:   There were no vitals filed for this visit. There is no height or weight on file to calculate BMI. Constitutional Well developed. Well nourished.  Vascular Dorsalis pedis pulses palpable bilaterally. Posterior tibial pulses palpable bilaterally. Capillary refill normal to all digits.  No cyanosis or clubbing noted. Pedal hair growth normal.  Neurologic Normal speech. Oriented to person, place, and time. Epicritic sensation to light touch grossly present bilaterally.  Dermatologic Bilateral hallux nails with thickening dystrophy subungual debris transverse ridging yellow discoloration No open wounds. No  skin lesions.  Orthopedic: Normal joint ROM without pain or crepitus bilaterally. No visible deformities. No bony tenderness.   Radiographs: None Assessment:   1. Nail dystrophy   2. Pain around toenail    Plan:  Patient was evaluated and treated and all questions answered.  Onychomycosis -Culture and histo reviewed with patient  -Indicative of truamatic changes no evidence of fungus -Discussed treatments including urea.  Return if symptoms worsen or fail to improve.

## 2019-01-25 DIAGNOSIS — Z01419 Encounter for gynecological examination (general) (routine) without abnormal findings: Secondary | ICD-10-CM | POA: Diagnosis not present

## 2019-02-12 DIAGNOSIS — Z1231 Encounter for screening mammogram for malignant neoplasm of breast: Secondary | ICD-10-CM | POA: Diagnosis not present

## 2019-03-25 DIAGNOSIS — Z8639 Personal history of other endocrine, nutritional and metabolic disease: Secondary | ICD-10-CM | POA: Diagnosis not present

## 2019-03-25 DIAGNOSIS — F339 Major depressive disorder, recurrent, unspecified: Secondary | ICD-10-CM | POA: Diagnosis not present

## 2019-03-25 DIAGNOSIS — E785 Hyperlipidemia, unspecified: Secondary | ICD-10-CM | POA: Diagnosis not present

## 2019-07-30 ENCOUNTER — Ambulatory Visit: Payer: Managed Care, Other (non HMO) | Attending: Internal Medicine

## 2019-07-30 DIAGNOSIS — Z20822 Contact with and (suspected) exposure to covid-19: Secondary | ICD-10-CM

## 2019-08-01 LAB — NOVEL CORONAVIRUS, NAA: SARS-CoV-2, NAA: NOT DETECTED

## 2020-05-13 ENCOUNTER — Other Ambulatory Visit (HOSPITAL_COMMUNITY): Payer: Self-pay | Admitting: Family Medicine

## 2020-05-13 DIAGNOSIS — R0989 Other specified symptoms and signs involving the circulatory and respiratory systems: Secondary | ICD-10-CM

## 2020-05-18 ENCOUNTER — Ambulatory Visit (HOSPITAL_COMMUNITY)
Admission: RE | Admit: 2020-05-18 | Discharge: 2020-05-18 | Disposition: A | Discharge status: Home / Self Care | Payer: 59 | Source: Ambulatory Visit | Attending: Family Medicine | Admitting: Family Medicine

## 2020-05-18 ENCOUNTER — Other Ambulatory Visit: Payer: Self-pay

## 2020-05-18 DIAGNOSIS — R0989 Other specified symptoms and signs involving the circulatory and respiratory systems: Secondary | ICD-10-CM | POA: Insufficient documentation

## 2020-05-18 NOTE — Progress Notes (Signed)
VASCULAR LAB    ABIs have been performed.  See CV proc for preliminary results.   Placida Cambre, RVT 05/18/2020, 9:47 AM

## 2020-05-21 ENCOUNTER — Encounter (HOSPITAL_COMMUNITY): Payer: Managed Care, Other (non HMO)

## 2020-07-14 ENCOUNTER — Other Ambulatory Visit: Payer: Self-pay | Admitting: Orthopedic Surgery

## 2020-07-14 DIAGNOSIS — M545 Low back pain, unspecified: Secondary | ICD-10-CM

## 2020-07-17 ENCOUNTER — Other Ambulatory Visit: Payer: Self-pay

## 2020-07-17 ENCOUNTER — Ambulatory Visit
Admission: RE | Admit: 2020-07-17 | Discharge: 2020-07-17 | Disposition: A | Discharge status: Home / Self Care | Payer: 59 | Source: Ambulatory Visit | Attending: Orthopedic Surgery | Admitting: Orthopedic Surgery

## 2020-07-17 DIAGNOSIS — M545 Low back pain, unspecified: Secondary | ICD-10-CM

## 2020-08-03 ENCOUNTER — Other Ambulatory Visit: Payer: 59

## 2021-03-31 ENCOUNTER — Ambulatory Visit
Admission: RE | Admit: 2021-03-31 | Discharge: 2021-03-31 | Disposition: A | Discharge status: Home / Self Care | Payer: Managed Care, Other (non HMO) | Source: Ambulatory Visit | Attending: Home Modifications | Admitting: Home Modifications

## 2021-03-31 ENCOUNTER — Other Ambulatory Visit: Payer: Self-pay | Admitting: Home Modifications

## 2021-03-31 DIAGNOSIS — R519 Headache, unspecified: Secondary | ICD-10-CM

## 2021-05-20 ENCOUNTER — Ambulatory Visit: Payer: 59 | Admitting: Cardiology

## 2021-06-22 ENCOUNTER — Encounter: Payer: Self-pay | Admitting: Cardiology

## 2021-06-22 ENCOUNTER — Ambulatory Visit (INDEPENDENT_AMBULATORY_CARE_PROVIDER_SITE_OTHER): Payer: Managed Care, Other (non HMO) | Admitting: Cardiology

## 2021-06-22 ENCOUNTER — Other Ambulatory Visit: Payer: Self-pay

## 2021-06-22 ENCOUNTER — Ambulatory Visit (INDEPENDENT_AMBULATORY_CARE_PROVIDER_SITE_OTHER): Payer: Managed Care, Other (non HMO)

## 2021-06-22 VITALS — BP 118/84 | HR 54 | Ht 63.0 in | Wt 154.4 lb

## 2021-06-22 DIAGNOSIS — R001 Bradycardia, unspecified: Secondary | ICD-10-CM

## 2021-06-22 DIAGNOSIS — Z79899 Other long term (current) drug therapy: Secondary | ICD-10-CM

## 2021-06-22 DIAGNOSIS — R7303 Prediabetes: Secondary | ICD-10-CM | POA: Diagnosis not present

## 2021-06-22 DIAGNOSIS — R079 Chest pain, unspecified: Secondary | ICD-10-CM | POA: Diagnosis not present

## 2021-06-22 DIAGNOSIS — E785 Hyperlipidemia, unspecified: Secondary | ICD-10-CM

## 2021-06-22 DIAGNOSIS — R072 Precordial pain: Secondary | ICD-10-CM

## 2021-06-22 LAB — BASIC METABOLIC PANEL
BUN/Creatinine Ratio: 11 (ref 9–23)
BUN: 6 mg/dL (ref 6–24)
CO2: 23 mmol/L (ref 20–29)
Calcium: 9.1 mg/dL (ref 8.7–10.2)
Chloride: 103 mmol/L (ref 96–106)
Creatinine, Ser: 0.55 mg/dL — ABNORMAL LOW (ref 0.57–1.00)
Glucose: 92 mg/dL (ref 70–99)
Potassium: 4.8 mmol/L (ref 3.5–5.2)
Sodium: 139 mmol/L (ref 134–144)
eGFR: 108 mL/min/{1.73_m2} (ref >59)

## 2021-06-22 LAB — LIPID PANEL
Chol/HDL Ratio: 5.5 ratio — ABNORMAL HIGH (ref 0.0–4.4)
Cholesterol, Total: 289 mg/dL — ABNORMAL HIGH (ref 100–199)
HDL: 53 mg/dL (ref >39)
LDL Chol Calc (NIH): 206 mg/dL — ABNORMAL HIGH (ref 0–99)
Triglycerides: 159 mg/dL — ABNORMAL HIGH (ref 0–149)
VLDL Cholesterol Cal: 30 mg/dL (ref 5–40)

## 2021-06-22 LAB — TSH+T4F+T3FREE
Free T4: 1.3 ng/dL (ref 0.82–1.77)
T3, Free: 2.5 pg/mL (ref 2.0–4.4)
TSH: 1.12 u[IU]/mL (ref 0.450–4.500)

## 2021-06-22 LAB — MAGNESIUM: Magnesium: 2.1 mg/dL (ref 1.6–2.3)

## 2021-06-22 LAB — HEMOGLOBIN A1C
Est. average glucose Bld gHb Est-mCnc: 128 mg/dL
Hgb A1c MFr Bld: 6.1 % — ABNORMAL HIGH (ref 4.8–5.6)

## 2021-06-22 MED ORDER — ATORVASTATIN CALCIUM 40 MG PO TABS: 40.0000 mg | ORAL_TABLET | Freq: Every day | ORAL | 3 refills | Status: AC

## 2021-06-22 MED ORDER — METOPROLOL TARTRATE 50 MG PO TABS: ORAL_TABLET | ORAL | 0 refills | Status: DC

## 2021-06-22 MED ORDER — NITROGLYCERIN 0.4 MG SL SUBL: 0.4000 mg | SUBLINGUAL_TABLET | SUBLINGUAL | 3 refills | Status: AC | PRN

## 2021-06-22 NOTE — Progress Notes (Unsigned)
Patient enrolled for [employer] to mail a 14 day ZIO XT monitor to her address on file. 

## 2021-06-22 NOTE — Progress Notes (Signed)
Cardiology Office Note:    Date:  06/22/2021   ID:  Carolyn Kirk, DOB [birth year], MRN [id number]  PCP:  Juluis Rainier, MD (Inactive)  Cardiologist:  Thomasene Ripple, DO  Electrophysiologist:  None   Referring MD: Dennie Maizes, NP   " I am having chest pain on and off"   History of Present Illness:    Carolyn Kirk is a 55 y.o. female with a hx of hyperlipidemia, prediabetes, hypertension, here today to be evaluated for intermittent chest discomfort.  The patient has been experiencing chest discomfort for many years.  Going back to 2016.  In 2017 she had a left heart catheterization which did not show any evidence of coronary artery disease.  In the meantime she tells me she did feel better but then she recently has been experiencing intermittent chest discomfort.  She describes it as a midsternal chest pain and heaviness..  In addition the patient tells me she has had some chest fluttering but has not had any lightheadedness or dizziness.  She is concerned given the significant cardiac history in her family.  Past Medical History:  Diagnosis Date   Depressive disorder, not elsewhere classified    Low vitamin B12 level    Onychomycosis    of great toenail   Other and unspecified hyperlipidemia    Pain in joint, pelvic region and thigh    Torticollis, unspecified     Past Surgical History:  Procedure Laterality Date   APPENDECTOMY     BREAST LUMPECTOMY Left    CARDIAC CATHETERIZATION N/A 10/21/2015   Procedure: Left Heart Cath and Coronary Angiography;  Surgeon: Lyn Records, MD;  Location: [facility] INVASIVE CV LAB;  Service: Cardiovascular;  Laterality: N/A;    Current Medications: Current Meds  Medication Sig   metoprolol tartrate (LOPRESSOR) 50 MG tablet Take 2 hours before CT scan   [DISCONTINUED] atorvastatin (LIPITOR) 40 MG tablet Take 1 tablet (40 mg total) by mouth daily.     Allergies:   Patient has no known allergies.   Social History   Socioeconomic History    Marital status: Married    Spouse name: Not on file   Number of children: Not on file   Years of education: Not on file   Highest education level: Not on file  Occupational History   Not on file  Tobacco Use   Smoking status: Never   Smokeless tobacco: Never  Substance and Sexual Activity   Alcohol use: No   Drug use: No   Sexual activity: Yes  Other Topics Concern   Not on file  Social History Narrative   Not on file   Social Determinants of Health   Financial Resource Strain: Not on file  Food Insecurity: Not on file  Transportation Needs: Not on file  Physical Activity: Not on file  Stress: Not on file  Social Connections: Not on file     Family History: The patient's family history includes CVA in her mother; Diabetes in her father; Diabetes Mellitus II in her sister; Esophageal cancer in her father; Hyperlipidemia in her brother; Testicular cancer in her father; Thyroid disease in her sister.  ROS:   Review of Systems  Constitution: Negative for decreased appetite, fever and weight gain.  HENT: Negative for congestion, ear discharge, hoarse voice and sore throat.   Eyes: Negative for discharge, redness, vision loss in right eye and visual halos.  Cardiovascular: Reports chest pain, and palpitations.  Negative for dyspnea on exertion, leg swelling, orthopnea. Respiratory: Negative  for cough, hemoptysis, shortness of breath and snoring.   Endocrine: Negative for heat intolerance and polyphagia.  Hematologic/Lymphatic: Negative for bleeding problem. Does not bruise/bleed easily.  Skin: Negative for flushing, nail changes, rash and suspicious lesions.  Musculoskeletal: Negative for arthritis, joint pain, muscle cramps, myalgias, neck pain and stiffness.  Gastrointestinal: Negative for abdominal pain, bowel incontinence, diarrhea and excessive appetite.  Genitourinary: Negative for decreased libido, genital sores and incomplete emptying.  Neurological: Negative for brief  paralysis, focal weakness, headaches and loss of balance.  Psychiatric/Behavioral: Negative for altered mental status, depression and suicidal ideas.  Allergic/Immunologic: Negative for HIV exposure and persistent infections.    EKGs/Labs/Other Studies Reviewed:    The following studies were reviewed today:   EKG:  The ekg ordered today demonstrates sinus bradycardia, heart rate 53 bpm with first-degree AV block.  Left heart catheterization October 21, 2015 Normal coronary arteries. Normal left ventricular function and upper normal LVEDP   Recommendations:   Per primary cardiologist. Other sources of chest pain can now be considered.  Recent Labs: 06/22/2021: BUN 6; Creatinine, Ser 0.55; Magnesium 2.1; Potassium 4.8; Sodium 139; TSH 1.120  Recent Lipid Panel    Component Value Date/Time   CHOL 289 (H) 06/22/2021 0938   CHOL 210 (H) 06/10/2015 1115   TRIG 159 (H) 06/22/2021 0938   TRIG 179 (H) 06/10/2015 1115   HDL 53 06/22/2021 0938   HDL 65 06/10/2015 1115   CHOLHDL 5.5 (H) 06/22/2021 0938   CHOLHDL 3.2 06/10/2015 1115   LDLCALC 206 (H) 06/22/2021 0938   LDLCALC 109 06/10/2015 1115    Physical Exam:    VS:  BP 118/84   Pulse (!) 54   Ht  (1.6 m)   Wt 154 lb 6.4 oz (70 kg)   LMP 08/08/2013 (Approximate)   SpO2 98%   BMI 27.35 kg/m     Wt Readings from Last 3 Encounters:  06/22/21 154 lb 6.4 oz (70 kg)  10/19/15 154 lb 3.2 oz (69.9 kg)  06/10/15 150 lb 9.6 oz (68.3 kg)     GEN: Well nourished, well developed in no acute distress HEENT: Normal NECK: No JVD; No carotid bruits LYMPHATICS: No lymphadenopathy CARDIAC: S1S2 noted,RRR, no murmurs, rubs, gallops RESPIRATORY:  Clear to auscultation without rales, wheezing or rhonchi  ABDOMEN: Soft, non-tender, non-distended, +bowel sounds, no guarding. EXTREMITIES: No edema, No cyanosis, no clubbing MUSCULOSKELETAL:  No deformity  SKIN: Warm and dry NEUROLOGIC:  Alert and oriented x 3, non-focal PSYCHIATRIC:   Normal affect, good insight  ASSESSMENT:    1. Chest pain of uncertain etiology   2. Prediabetes   3. Sinus bradycardia   4. Hyperlipidemia, unspecified hyperlipidemia type   5. Precordial pain   6. Medication management    PLAN:     She is now recently again been experiencing chest discomfort.  She had a heart catheterization about 5 years ago and with her risk factors I like to get a coronary CTA to make sure that she does not develop any coronary artery disease.  In the meantime if her coronary CTA turns out to be negative without evidence of any coronary artery disease microvascular angina will be of concern and we can explore options for diagnostic opportunities and treatment with antianginals as well.  Her EKG shows evidence of sinus bradycardia with first-degree AV block she is not on any AV nodal blockers.  I will place a monitor the patient to make sure that she is not experiencing any sinus node  dysfunction.  In addition we will get blood work for [facility] as well per  We will repeat her lipid profile, hemoglobin A1c as well as BMP.  Hyperlipidemia - continue with current statin medication.  We discussed the fact that she does have a diagnosis of prediabetes.  We will repeat her hemoglobin A1c.  Lifestyle modification advised.  The patient is in agreement with the above plan. The patient left the office in stable condition.  The patient will follow up in 1 year or sooner if needed.   Medication Adjustments/Labs and Tests Ordered: Current medicines are reviewed at length with the patient today.  Concerns regarding medicines are outlined above.  Orders Placed This Encounter  Procedures   CT CORONARY MORPH W/CTA COR W/SCORE W/CA W/CM &/OR WO/CM   Basic Metabolic Panel (BMET)   Magnesium   TSH+T4F+T3Free   Lipid panel   Hemoglobin A1c   LONG TERM MONITOR (3-14 DAYS)   EKG 12-Lead   Meds ordered this encounter  Medications   nitroGLYCERIN (NITROSTAT) 0.4 MG SL tablet     Sig: Place 1 tablet (0.4 mg total) under the tongue every 5 (five) minutes as needed for chest pain.    Dispense:  25 tablet    Refill:  3   atorvastatin (LIPITOR) 40 MG tablet    Sig: Take 1 tablet (40 mg total) by mouth daily.    Dispense:  90 tablet    Refill:  3   metoprolol tartrate (LOPRESSOR) 50 MG tablet    Sig: Take 2 hours before CT scan    Dispense:  1 tablet    Refill:  0    Patient Instructions  Medication Instructions:  Your physician recommends that you continue on your current medications as directed. Please refer to the Current Medication list given to you today.  *If you need a refill on your cardiac medications before your next appointment, please call your pharmacy*   Lab Work: Your physician recommends that you return for lab work in:  TODAY:BMET, Mag, TSH+T3+T4, HgbA1C, Lipids If you have labs (blood work) drawn today and your tests are completely normal, you will receive your results only by: MyChart Message (if you have MyChart) OR A paper copy in the mail If you have any lab test that is abnormal or we need to change your treatment, we will call you to review the results.   Testing/Procedures:   Your cardiac CT will be scheduled at one of the below locations:   [facility] [address] [place], [place] [address] [phone]  If scheduled at [facility], please arrive at the [facility] main entrance (entrance A) of [facility] 30 minutes prior to test start time. You can use the FREE valet parking offered at the main entrance (encouraged to control the heart rate for the test) Proceed to the [facility] Radiology Department (first floor) to check-in and test prep.   Please follow these instructions carefully (unless otherwise directed):   On the Night Before the Test: Be sure to Drink plenty of water. Do not consume any caffeinated/decaffeinated beverages or chocolate 12 hours prior to your test. Do not take any  antihistamines 12 hours prior to your test.   On the Day of the Test: Drink plenty of water until 1 hour prior to the test. Do not eat any food 4 hours prior to the test. You may take your regular medications prior to the test.  Take metoprolol (Lopressor) two hours prior  to test. FEMALES- please wear underwire-free bra if available, avoid dresses & tight clothing       After the Test: Drink plenty of water. After receiving IV contrast, you may experience a mild flushed feeling. This is normal. On occasion, you may experience a mild rash up to 24 hours after the test. This is not dangerous. If this occurs, you can take Benadryl 25 mg and increase your fluid intake. If you experience trouble breathing, this can be serious. If it is severe call 911 IMMEDIATELY. If it is mild, please call our office. If you take any of these medications: Glipizide/Metformin, Avandament, Glucavance, please do not take 48 hours after completing test unless otherwise instructed.  Please allow 2-4 weeks for scheduling of routine cardiac CTs. Some insurance companies require a pre-authorization which may delay scheduling of this test.   For non-scheduling related questions, please contact the cardiac imaging nurse navigator should you have any questions/concerns: Rockwell Alexandria, Cardiac Imaging Nurse Navigator Larey Brick, Cardiac Imaging Nurse Navigator [facility] Direct Office Dial: [phone]   For scheduling needs, including cancellations and rescheduling, please call [place], [phone].   ZIO XT- Long Term Monitor Instructions  Your physician has requested you wear a ZIO patch monitor for 14 days.  This is a single patch monitor. [employer] supplies one patch monitor per enrollment. Additional stickers are not available. Please do not apply patch if you will be having a Nuclear Stress Test,  Echocardiogram, Cardiac CT, MRI, or Chest Xray during the period you would be  wearing the  monitor. The patch cannot be worn during these tests. You cannot remove and re-apply the  ZIO XT patch monitor.  Your ZIO patch monitor will be mailed 3 day USPS to your address on file. It may take 3-5 days  to receive your monitor after you have been enrolled.  Once you have received your monitor, please review the enclosed instructions. Your monitor  has already been registered assigning a specific monitor serial # to you.  Billing and Patient Assistance Program Information  We have supplied [employer] with any of your insurance information on file for billing purposes. [employer] offers a sliding scale Patient Assistance Program for patients that do not have  insurance, or whose insurance does not completely cover the cost of the ZIO monitor.  You must apply for the Patient Assistance Program to qualify for this discounted rate.  To apply, please call [employer] at [phone], select option 4, select option 2, ask to apply for  Patient Assistance Program. Meredeth Ide will ask your household income, and how many people  are in your household. They will quote your out-of-pocket cost based on that information.  [employer] will also be able to set up a 54-month, interest-free payment plan if needed.  Applying the monitor   Shave hair from upper left chest.  Hold abrader disc by orange tab. Rub abrader in 40 strokes over the upper left chest as  indicated in your monitor instructions.  Clean area with 4 enclosed alcohol pads. Let dry.  Apply patch as indicated in monitor instructions. Patch will be placed under collarbone on left  side of chest with arrow pointing upward.  Rub patch adhesive wings for 2 minutes. Remove white label marked "1". Remove the white  label marked "2". Rub patch adhesive wings for 2 additional minutes.  While looking in a mirror, press and release button in center of patch. A small green light will  flash 3-4 times. This  will be your only indicator that the  monitor has been turned on.  Do not shower for the first 24 hours. You may shower after the first 24 hours.  Press the button if you feel a symptom. You will hear a small click. Record Date, Time and  Symptom in the Patient Logbook.  When you are ready to remove the patch, follow instructions on the last 2 pages of Patient  Logbook. Stick patch monitor onto the last page of Patient Logbook.  Place Patient Logbook in the blue and white box. Use locking tab on box and tape box closed  securely. The blue and white box has prepaid postage on it. Please place it in the mailbox as  soon as possible. Your physician should have your test results approximately 7 days after the  monitor has been mailed back to [facility].  Call [facility] Customer Care at [phone] if you have questions regarding  your ZIO XT patch monitor. Call them immediately if you see an orange light blinking on your  monitor.  If your monitor falls off in less than 4 days, contact our Monitor department at [phone].  If your monitor becomes loose or falls off after 4 days call [employer] at [phone] for  suggestions on securing your monitor.    Follow-Up: At [facility], you and your health needs are our priority.  As part of our continuing mission to provide you with exceptional heart care, we have created designated Provider Care Teams.  These Care Teams include your primary Cardiologist (physician) and Advanced Practice Providers (APPs -  Physician Assistants and Nurse Practitioners) who all work together to provide you with the care you need, when you need it.  We recommend signing up for the patient portal called "MyChart".  Sign up information is provided on this After Visit Summary.  MyChart is used to connect with patients for Virtual Visits (Telemedicine).  Patients are able to view lab/test results, encounter notes, upcoming appointments, etc.  Non-urgent messages can be sent to your provider as  well.   To learn more about what you can do with MyChart, go to [email or web address].    Your next appointment:   1 year(s)  The format for your next appointment:   In Person  Provider:   Thomasene Ripple, DO   Other Instructions      Adopting a Healthy Lifestyle.  Know what a healthy weight is for you (roughly BMI <25) and aim to maintain this   Aim for 7+ servings of fruits and vegetables daily   65-80+ fluid ounces of water or unsweet tea for healthy kidneys   Limit to max 1 drink of alcohol per day; avoid smoking/tobacco   Limit animal fats in diet for cholesterol and heart health - choose grass fed whenever available   Avoid highly processed foods, and foods high in saturated/trans fats   Aim for low stress - take time to unwind and care for your mental health   Aim for 150 min of moderate intensity exercise weekly for heart health, and weights twice weekly for bone health   Aim for 7-9 hours of sleep daily   When it comes to diets, agreement about the perfect plan isnt easy to find, even among the experts. Experts at the [facility] [employer] developed an idea known as the Healthy Eating Plate. Just imagine a plate divided into logical, healthy portions.   The emphasis is on diet quality:   Load up on vegetables and  fruits - one-half of your plate: Aim for color and variety, and remember that potatoes dont count.   Go for whole grains - one-quarter of your plate: Whole wheat, barley, wheat berries, quinoa, oats, brown rice, and foods made with them. If you want pasta, go with whole wheat pasta.   Protein power - one-quarter of your plate: Fish, chicken, beans, and nuts are all healthy, versatile protein sources. Limit red meat.   The diet, however, does go beyond the plate, offering a few other suggestions.   Use healthy plant oils, such as olive, canola, soy, corn, sunflower and peanut. Check the labels, and avoid partially hydrogenated oil, which  have unhealthy trans fats.   If youre thirsty, drink water. Coffee and tea are good in moderation, but skip sugary drinks and limit milk and dairy products to one or two daily servings.   The type of carbohydrate in the diet is more important than the amount. Some sources of carbohydrates, such as vegetables, fruits, whole grains, and beans-are healthier than others.   Finally, stay active  Signed, Thomasene Ripple, DO  06/22/2021 10:00 PM    [facility]

## 2021-06-22 NOTE — Patient Instructions (Addendum)
Medication Instructions:  Your physician recommends that you continue on your current medications as directed. Please refer to the Current Medication list given to you today.  *If you need a refill on your cardiac medications before your next appointment, please call your pharmacy*   Lab Work: Your physician recommends that you return for lab work in:  TODAY:BMET, Mag, TSH+T3+T4, HgbA1C, Lipids If you have labs (blood work) drawn today and your tests are completely normal, you will receive your results only by: MyChart Message (if you have MyChart) OR A paper copy in the mail If you have any lab test that is abnormal or we need to change your treatment, we will call you to review the results.   Testing/Procedures:   Your cardiac CT will be scheduled at one of the below locations:   [facility] [address] [place], [place] [address] [phone]  If scheduled at [facility], please arrive at the [facility] main entrance (entrance A) of [facility] 30 minutes prior to test start time. You can use the FREE valet parking offered at the main entrance (encouraged to control the heart rate for the test) Proceed to the [facility] Radiology Department (first floor) to check-in and test prep.   Please follow these instructions carefully (unless otherwise directed):   On the Night Before the Test: Be sure to Drink plenty of water. Do not consume any caffeinated/decaffeinated beverages or chocolate 12 hours prior to your test. Do not take any antihistamines 12 hours prior to your test.   On the Day of the Test: Drink plenty of water until 1 hour prior to the test. Do not eat any food 4 hours prior to the test. You may take your regular medications prior to the test.  Take metoprolol (Lopressor) two hours prior to test. FEMALES- please wear underwire-free bra if available, avoid dresses & tight clothing       After the Test: Drink plenty of  water. After receiving IV contrast, you may experience a mild flushed feeling. This is normal. On occasion, you may experience a mild rash up to 24 hours after the test. This is not dangerous. If this occurs, you can take Benadryl 25 mg and increase your fluid intake. If you experience trouble breathing, this can be serious. If it is severe call 911 IMMEDIATELY. If it is mild, please call our office. If you take any of these medications: Glipizide/Metformin, Avandament, Glucavance, please do not take 48 hours after completing test unless otherwise instructed.  Please allow 2-4 weeks for scheduling of routine cardiac CTs. Some insurance companies require a pre-authorization which may delay scheduling of this test.   For non-scheduling related questions, please contact the cardiac imaging nurse navigator should you have any questions/concerns: Rockwell Alexandria, Cardiac Imaging Nurse Navigator Larey Brick, Cardiac Imaging Nurse Navigator [facility] Direct Office Dial: [phone]   For scheduling needs, including cancellations and rescheduling, please call [place], [phone].   ZIO XT- Long Term Monitor Instructions  Your physician has requested you wear a ZIO patch monitor for 14 days.  This is a single patch monitor. [employer] supplies one patch monitor per enrollment. Additional stickers are not available. Please do not apply patch if you will be having a Nuclear Stress Test,  Echocardiogram, Cardiac CT, MRI, or Chest Xray during the period you would be wearing the  monitor. The patch cannot be worn during these tests. You cannot remove and re-apply the  ZIO  XT patch monitor.  Your ZIO patch monitor will be mailed 3 day USPS to your address on file. It may take 3-5 days  to receive your monitor after you have been enrolled.  Once you have received your monitor, please review the enclosed instructions. Your monitor  has already been registered assigning a  specific monitor serial # to you.  Billing and Patient Assistance Program Information  We have supplied [employer] with any of your insurance information on file for billing purposes. [employer] offers a sliding scale Patient Assistance Program for patients that do not have  insurance, or whose insurance does not completely cover the cost of the ZIO monitor.  You must apply for the Patient Assistance Program to qualify for this discounted rate.  To apply, please call [employer] at [phone], select option 4, select option 2, ask to apply for  Patient Assistance Program. Meredeth Ide will ask your household income, and how many people  are in your household. They will quote your out-of-pocket cost based on that information.  [employer] will also be able to set up a 71-month, interest-free payment plan if needed.  Applying the monitor   Shave hair from upper left chest.  Hold abrader disc by orange tab. Rub abrader in 40 strokes over the upper left chest as  indicated in your monitor instructions.  Clean area with 4 enclosed alcohol pads. Let dry.  Apply patch as indicated in monitor instructions. Patch will be placed under collarbone on left  side of chest with arrow pointing upward.  Rub patch adhesive wings for 2 minutes. Remove white label marked "1". Remove the white  label marked "2". Rub patch adhesive wings for 2 additional minutes.  While looking in a mirror, press and release button in center of patch. A small green light will  flash 3-4 times. This will be your only indicator that the monitor has been turned on.  Do not shower for the first 24 hours. You may shower after the first 24 hours.  Press the button if you feel a symptom. You will hear a small click. Record Date, Time and  Symptom in the Patient Logbook.  When you are ready to remove the patch, follow instructions on the last 2 pages of Patient  Logbook. Stick patch monitor onto the last page of Patient Logbook.  Place Patient  Logbook in the blue and white box. Use locking tab on box and tape box closed  securely. The blue and white box has prepaid postage on it. Please place it in the mailbox as  soon as possible. Your physician should have your test results approximately 7 days after the  monitor has been mailed back to [facility].  Call [facility] Customer Care at [phone] if you have questions regarding  your ZIO XT patch monitor. Call them immediately if you see an orange light blinking on your  monitor.  If your monitor falls off in less than 4 days, contact our Monitor department at [phone].  If your monitor becomes loose or falls off after 4 days call [employer] at [phone] for  suggestions on securing your monitor.    Follow-Up: At [facility], you and your health needs are our priority.  As part of our continuing mission to provide you with exceptional heart care, we have created designated Provider Care Teams.  These Care Teams include your primary Cardiologist (physician) and Advanced Practice Providers (APPs -  Physician Assistants and Nurse Practitioners) who all work together to provide you with the  care you need, when you need it.  We recommend signing up for the patient portal called "MyChart".  Sign up information is provided on this After Visit Summary.  MyChart is used to connect with patients for Virtual Visits (Telemedicine).  Patients are able to view lab/test results, encounter notes, upcoming appointments, etc.  Non-urgent messages can be sent to your provider as well.   To learn more about what you can do with MyChart, go to [email or web address].    Your next appointment:   1 year(s)  The format for your next appointment:   In Person  Provider:   Thomasene Ripple, DO   Other Instructions

## 2021-06-24 ENCOUNTER — Telehealth (HOSPITAL_COMMUNITY): Payer: Self-pay | Admitting: Emergency Medicine

## 2021-06-24 NOTE — Telephone Encounter (Signed)
Reaching out to patient to offer assistance regarding upcoming cardiac imaging study; pt verbalizes understanding of appt date/time, parking situation and where to check in, pre-test NPO status and medications ordered, and verified current allergies; name and call back number provided for further questions should they arise Rockwell Alexandria RN Navigator Cardiac Imaging [facility] [phone] office [phone] cell  Arrival time 10:00 Resting HR 55 (will check HR at 8am to see if she need additional metoprolol) [place] Dec

## 2021-06-28 ENCOUNTER — Ambulatory Visit (HOSPITAL_COMMUNITY)
Admission: RE | Admit: 2021-06-28 | Discharge: 2021-06-28 | Disposition: A | Discharge status: Home / Self Care | Payer: Managed Care, Other (non HMO) | Source: Ambulatory Visit | Attending: Cardiology | Admitting: Cardiology

## 2021-06-28 ENCOUNTER — Other Ambulatory Visit: Payer: Self-pay

## 2021-06-28 DIAGNOSIS — R072 Precordial pain: Secondary | ICD-10-CM

## 2021-06-28 MED ORDER — IOHEXOL 300 MG/ML  SOLN
100.0000 mL | Freq: Once | INTRAMUSCULAR | Status: AC | PRN
  Administered 2021-06-28: 100 mL via INTRAVENOUS

## 2021-06-28 MED ORDER — NITROGLYCERIN 0.4 MG SL SUBL
SUBLINGUAL_TABLET | SUBLINGUAL | Status: AC
  Administered 2021-06-28: 0.8 mg via SUBLINGUAL
  Filled 2021-06-28: qty 2

## 2021-06-28 MED ORDER — NITROGLYCERIN 0.4 MG SL SUBL: 0.8000 mg | SUBLINGUAL_TABLET | Freq: Once | SUBLINGUAL | Status: AC

## 2021-06-29 DIAGNOSIS — R001 Bradycardia, unspecified: Secondary | ICD-10-CM

## 2021-07-21 ENCOUNTER — Other Ambulatory Visit: Payer: Self-pay

## 2021-07-21 ENCOUNTER — Telehealth: Payer: Self-pay | Admitting: Cardiology

## 2021-07-21 ENCOUNTER — Encounter: Payer: Self-pay | Admitting: Cardiology

## 2021-07-21 DIAGNOSIS — I455 Other specified heart block: Secondary | ICD-10-CM

## 2021-07-21 NOTE — Telephone Encounter (Signed)
New Message:     Please call have Critical result- Use Reference#  [id number]

## 2021-07-21 NOTE — Telephone Encounter (Signed)
Dr.Tobb already reviewed monitor.Patient has appointment with EP Dr.Camnitz 12/16 at 1:45 pm.

## 2021-07-21 NOTE — Progress Notes (Signed)
Med list updated. Referral made.

## 2021-07-22 ENCOUNTER — Encounter: Payer: Self-pay | Admitting: Cardiology

## 2021-07-22 NOTE — Telephone Encounter (Signed)
error 

## 2021-07-23 ENCOUNTER — Ambulatory Visit (INDEPENDENT_AMBULATORY_CARE_PROVIDER_SITE_OTHER): Payer: Managed Care, Other (non HMO) | Admitting: Cardiology

## 2021-07-23 ENCOUNTER — Other Ambulatory Visit: Payer: Self-pay

## 2021-07-23 ENCOUNTER — Encounter: Payer: Self-pay | Admitting: Cardiology

## 2021-07-23 VITALS — BP 124/68 | HR 71 | Ht 64.0 in | Wt 155.2 lb

## 2021-07-23 DIAGNOSIS — I442 Atrioventricular block, complete: Secondary | ICD-10-CM

## 2021-07-23 NOTE — Progress Notes (Signed)
Electrophysiology Office Note   Date:  07/23/2021   ID:  Carolyn Kirk, Carolyn Kirk [birth year], MRN [id number]  PCP:  Shon Hale, MD  Cardiologist:  Tobb Primary Electrophysiologist:  Keyshon Stein Jorja Loa, MD    Chief Complaint: pauses   History of Present Illness: Carolyn Kirk is a 55 y.o. female who is being seen today for the evaluation of pauses at the request of Tobb, Kardie, DO. Presenting today for electrophysiology evaluation.  She has a history significant for hypertension, prediabetes, hyperlipidemia.  She presented to cardiology clinic with intermittent chest discomfort.  She has been having intermittent chest discomfort since 2016.  In 2017 she had a normal left heart catheterization.  Today, she denies symptoms of palpitations, chest pain, shortness of breath, orthopnea, PND, lower extremity edema, claudication, dizziness, presyncope, syncope, bleeding, or neurologic sequela. The patient is tolerating medications without difficulties.    Past Medical History:  Diagnosis Date   Depressive disorder, not elsewhere classified    Low vitamin B12 level    Onychomycosis    of great toenail   Other and unspecified hyperlipidemia    Pain in joint, pelvic region and thigh    Torticollis, unspecified    Past Surgical History:  Procedure Laterality Date   APPENDECTOMY     BREAST LUMPECTOMY Left    CARDIAC CATHETERIZATION N/A 10/21/2015   Procedure: Left Heart Cath and Coronary Angiography;  Surgeon: Lyn Records, MD;  Location[facility] INVASIVE CV LAB;  Service: Cardiovascular;  Laterality: N/A;     Current Outpatient Medications  Medication Sig Dispense Refill   atorvastatin (LIPITOR) 40 MG tablet Take 1 tablet (40 mg total) by mouth daily. 90 tablet 3   nitroGLYCERIN (NITROSTAT) 0.4 MG SL tablet Place 1 tablet (0.4 mg total) under the tongue every 5 (five) minutes as needed for chest pain. 25 tablet 3   fluconazole (DIFLUCAN) 150 MG tablet Take 1 tablet (150 mg total)  by mouth once a week. (Patient not taking: Reported on 06/22/2021) 6 tablet 1   ofloxacin (OCUFLOX) 0.3 % ophthalmic solution PLACE ONE DROP IN OD QID UTD (Patient not taking: Reported on 06/22/2021)  1   No current facility-administered medications for this visit.    Allergies:   Patient has no known allergies.   Social History:  The patient  reports that she has never smoked. She has never used smokeless tobacco. She reports that she does not drink alcohol and does not use drugs.   Family History:  The patient's family history includes CVA in her mother; Diabetes in her father; Diabetes Mellitus II in her sister; Esophageal cancer in her father; Hyperlipidemia in her brother; Testicular cancer in her father; Thyroid disease in her sister.    ROS:  Please see the history of present illness.   Otherwise, review of systems is positive for none.   All other systems are reviewed and negative.    PHYSICAL EXAM: VS:  BP 124/68    Pulse 71    Ht 5\' 4"  (1.626 m)    Wt 155 lb 3.2 oz (70.4 kg)    LMP 08/08/2013 (Approximate)    SpO2 99%    BMI 26.64 kg/m  , BMI Body mass index is 26.64 kg/m. GEN: Well nourished, well developed, in no acute distress  HEENT: normal  Neck: no JVD, carotid bruits, or masses Cardiac: RRR; no murmurs, rubs, or gallops,no edema  Respiratory:  clear to auscultation bilaterally, normal work of breathing GI: soft, nontender, nondistended, + BS MS:  no deformity or atrophy  Skin: warm and dry Neuro:  Strength and sensation are intact Psych: euthymic mood, full affect  EKG:  EKG is not ordered today. Personal review of the ekg ordered 06/22/21 shows sinus rhythm, first-degree AV block, anterior T wave inversions  Recent Labs: 06/22/2021: BUN 6; Creatinine, Ser 0.55; Magnesium 2.1; Potassium 4.8; Sodium 139; TSH 1.120    Lipid Panel     Component Value Date/Time   CHOL 289 (H) 06/22/2021 0938   CHOL 210 (H) 06/10/2015 1115   TRIG 159 (H) 06/22/2021 0938   TRIG  179 (H) 06/10/2015 1115   HDL 53 06/22/2021 0938   HDL 65 06/10/2015 1115   CHOLHDL 5.5 (H) 06/22/2021 0938   CHOLHDL 3.2 06/10/2015 1115   LDLCALC 206 (H) 06/22/2021 0938   LDLCALC 109 06/10/2015 1115     Wt Readings from Last 3 Encounters:  07/23/21 155 lb 3.2 oz (70.4 kg)  06/22/21 154 lb 6.4 oz (70 kg)  10/19/15 154 lb 3.2 oz (69.9 kg)      Other studies Reviewed: Additional studies/ records that were reviewed today include: Coronary CTA 06/28/2021 Review of the above records today demonstrates:  1. Coronary calcium score of 0. 2. Normal coronary origin with right dominance. 3. Normal coronary arteries.  Cardiac monitor 07/21/2021 personally reviewed 1.  Symptomatic high-grade AV block 2.  Sinus pauses, with the longest lasting pause for second 3.  Paroxysmal supraventricular tachycardia.  ASSESSMENT AND PLAN:  1.  Chest pain: Cardiac cath 5 years ago without coronary artery disease.  Coronary CT showed no major abnormality.  Plan per primary cardiology.  2.  Sinus pauses: Occurred multiple times while wearing cardiac monitoring.  She had symptoms, but these were associated with sinus rhythm.  Her pauses were all nocturnal, mostly between 2 and 6 AM.  Due to this, we Marylen Zuk continue with current management and Tekisha Darcey hold off on pacemaker implant.  If she has documented pauses during the day that are associated with symptoms, pacemaker implant would potentially be indicated.  Case discussed with primary cardiology  Current medicines are reviewed at length with the patient today.   The patient does not have concerns regarding her medicines.  The following changes were made today:  none  Labs/ tests ordered today include:  No orders of the defined types were placed in this encounter.    Disposition:   FU with Christasia Angeletti as needed d  Signed, Dudley Mages Jorja Loa, MD  07/23/2021 2:30 PM     [facility] [address] [place]  [address] [phone] (office) [phone] (fax)

## 2021-07-23 NOTE — Patient Instructions (Signed)
Medication Instructions:  Your physician recommends that you continue on your current medications as directed. Please refer to the Current Medication list given to you today.  *If you need a refill on your cardiac medications before your next appointment, please call your pharmacy*   Lab Work: None ordered   Testing/Procedures: None ordered   Follow-Up: At [facility], you and your health needs are our priority.  As part of our continuing mission to provide you with exceptional heart care, we have created designated Provider Care Teams.  These Care Teams include your primary Cardiologist (physician) and Advanced Practice Providers (APPs -  Physician Assistants and Nurse Practitioners) who all work together to provide you with the care you need, when you need it.  Your next appointment:   as  needed  The format for your next appointment:   In Person  Provider:   Will Camnitz, MD    Thank you for choosing [facility]!!   Nazifa Trinka, RN [phone]        

## 2022-02-19 IMAGING — CT CT HEART MORP W/ CTA COR W/ SCORE W/ CA W/CM &/OR W/O CM
4 of 7 series · 8 of 20 positions shown, 9 images · IV contrast (APPLIED)
Comparison: None.
COMPARISON: None.

Addendum:
EXAM:
OVER-READ INTERPRETATION  CT CHEST

The following report is an over-read performed by radiologist Dr.
Griffin Casa [REDACTED] on 06/28/2021. This
over-read does not include interpretation of cardiac or coronary
anatomy or pathology. The coronary calcium score/coronary CTA
interpretation by the cardiologist is attached.
CLINICAL DATA: Chest pain
Cardiac/Coronary CTA
TECHNIQUE: A non-contrast, gated CT scan was obtained with axial slices of 3 mm
through the heart for calcium scoring. Calcium scoring was performed
using the Agatston method. A 100 kV prospective, gated, contrast
cardiac scan was obtained. Gantry rotation speed was 250 msecs and
collimation was 0.6 mm. Two sublingual nitroglycerin tablets (0.8
mg) were given. The 3D data set was reconstructed in 5% intervals of
the 35-75% of the R-R cycle. Diastolic phases were analyzed on a
dedicated workstation using MPR, MIP, and VRT modes. The patient
received 95 cc of contrast.

[Series 6: ts diast sharp · axial · 0.32mm/px · z∈[-144,-111]mm · 2 of 248 slices shown]
[im 83/248  lung]
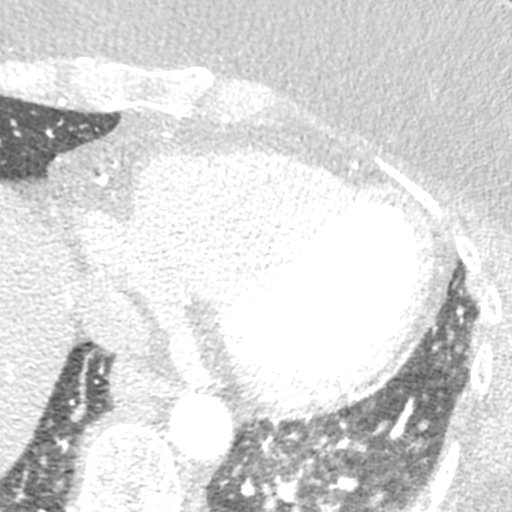
[im 165/248  lung]
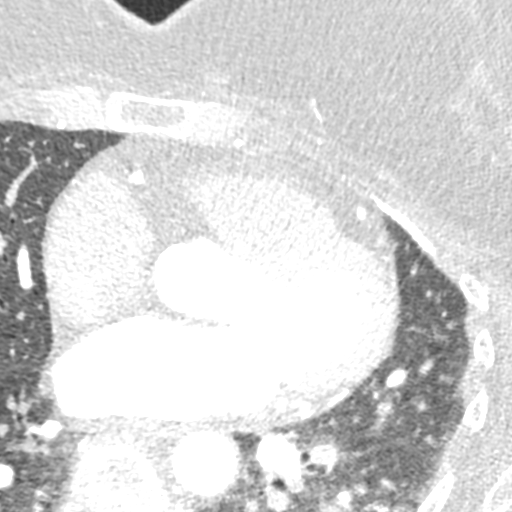

[Series 7: ts syst sharp · axial · 0.32mm/px · z∈[-144,-111]mm · 2 of 248 slices shown]
[im 83/248  lung]
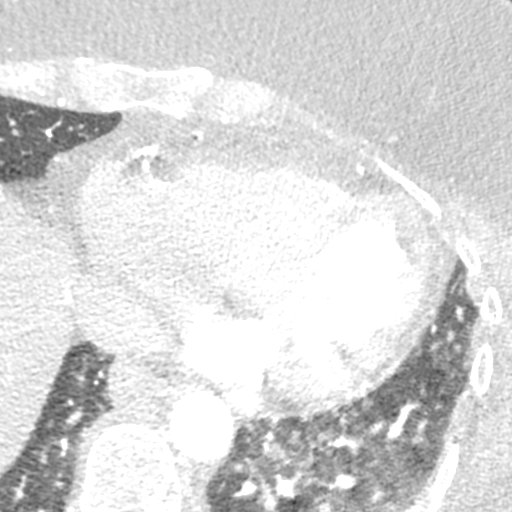
[im 165/248  lung]
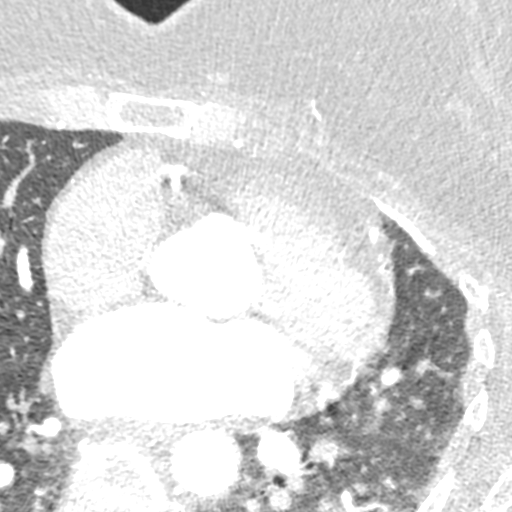

[Series 8: best diast · axial · 0.32mm/px · z∈[-144,-111]mm · 2 of 248 slices shown, 3 images]
[im 83/248  vessel]
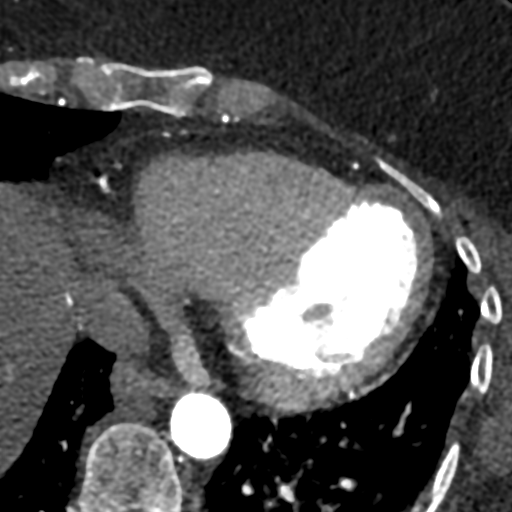
[im 83/248  lung]
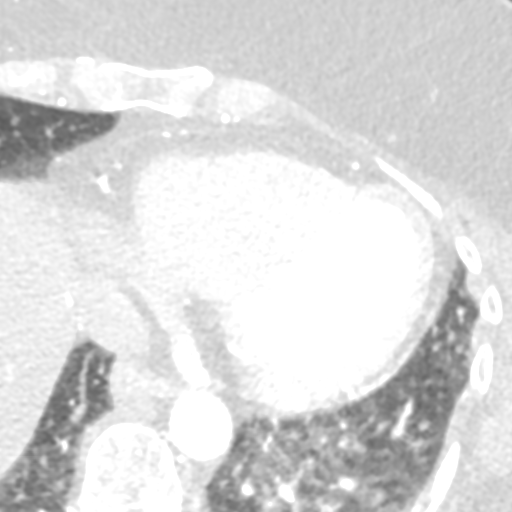
[im 165/248  vessel]
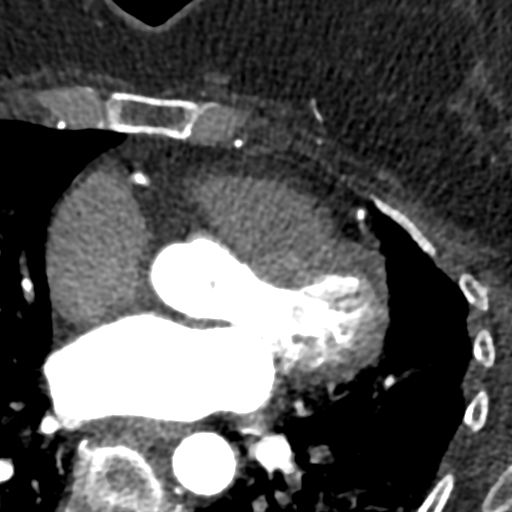

[Series 9: best syst · axial · 0.32mm/px · z∈[-144,-111]mm · 2 of 248 slices shown]
[im 83/248  vessel]
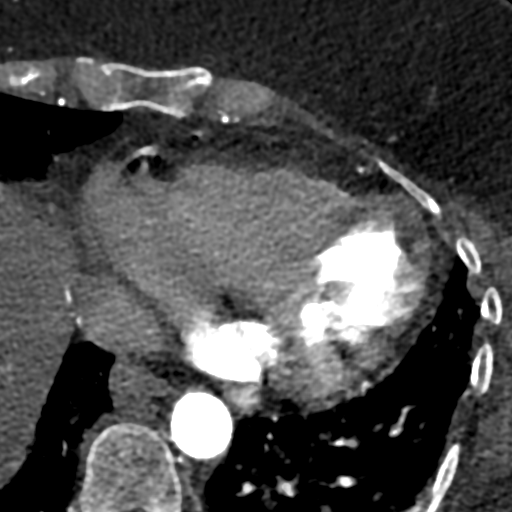
[im 165/248  vessel]
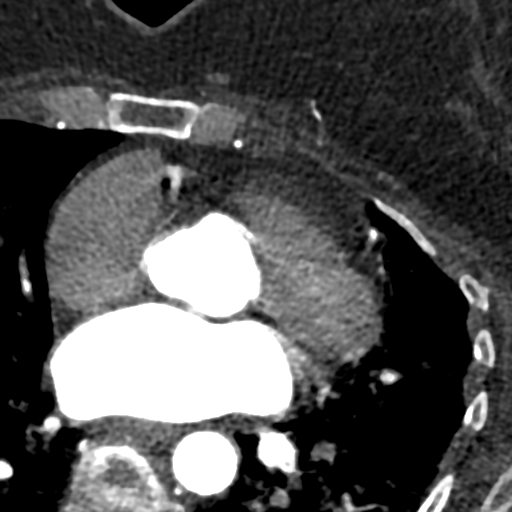

[8 of 20 positions shown; findings below may reference images not displayed]

FINDINGS: Within the visualized portions of the thorax there are no suspicious
appearing pulmonary nodules or masses, there is no acute
consolidative airspace disease, no pleural effusions, no
pneumothorax and no lymphadenopathy. Visualized portions of the
upper abdomen are unremarkable. There are no aggressive appearing
lytic or blastic lesions noted in the visualized portions of the
skeleton.
IMPRESSION: 1. No significant incidental noncardiac findings are noted.
FINDINGS: Image quality: Average. Stair step artifact due to irregular heart
rhythm.

Noise artifact is: Limited.

Coronary Arteries:  Normal coronary origin.  Right dominance.

Left main: The left main is a large caliber vessel with a normal
take off from the left coronary cusp that bifurcates to form a left
anterior descending artery and a left circumflex artery. There is no
plaque or stenosis.

Left anterior descending artery: The LAD is patent without evidence
of plaque or stenosis. The LAD gives off 2 patent diagonal branches.

Left circumflex artery: The LCX is non-dominant and patent with no
evidence of plaque or stenosis. The LCX gives off 2 patent obtuse
marginal branches.

Right coronary artery: The RCA is dominant with normal take off from
the right coronary cusp. There is no evidence of plaque or stenosis.
The RCA terminates as a PDA and right posterolateral branch without
evidence of plaque or stenosis.

Right Atrium: Right atrial size is within normal limits.

Right Ventricle: The right ventricular cavity is within normal
limits.

Left Atrium: Left atrial size is normal in size with no left atrial
appendage filling defect.

Left Ventricle: The ventricular cavity size is within normal limits.
There are no stigmata of prior infarction. There is no abnormal
filling defect.

Pulmonary arteries: Normal in size without proximal filling defect.

Pulmonary veins: Normal pulmonary venous drainage.

Pericardium: Normal thickness with no significant effusion or
calcium present.

Cardiac valves: The aortic valve is trileaflet without significant
calcification. The mitral valve is normal structure without
significant calcification.

Aorta: Normal caliber with no significant disease.

Extra-cardiac findings: See attached radiology report for
non-cardiac structures.
IMPRESSION: 1. Coronary calcium score of 0.

2. Normal coronary origin with right dominance.

3. Normal coronary arteries.

RECOMMENDATIONS:
1. No evidence of CAD (0%). Consider non-atherosclerotic causes of
chest pain.

*** End of Addendum ***
EXAM:
OVER-READ INTERPRETATION  CT CHEST

The following report is an over-read performed by radiologist Dr.
Griffin Casa [REDACTED] on 06/28/2021. This
over-read does not include interpretation of cardiac or coronary
anatomy or pathology. The coronary calcium score/coronary CTA
interpretation by the cardiologist is attached.
FINDINGS: Within the visualized portions of the thorax there are no suspicious
appearing pulmonary nodules or masses, there is no acute
consolidative airspace disease, no pleural effusions, no
pneumothorax and no lymphadenopathy. Visualized portions of the
upper abdomen are unremarkable. There are no aggressive appearing
lytic or blastic lesions noted in the visualized portions of the
skeleton.
IMPRESSION: 1. No significant incidental noncardiac findings are noted.

## 2022-06-28 ENCOUNTER — Ambulatory Visit: Payer: 59 | Attending: Cardiology | Admitting: Cardiology

## 2022-06-28 DIAGNOSIS — R5383 Other fatigue: Secondary | ICD-10-CM | POA: Diagnosis not present

## 2022-06-28 DIAGNOSIS — I455 Other specified heart block: Secondary | ICD-10-CM

## 2022-06-28 DIAGNOSIS — R400 Somnolence: Secondary | ICD-10-CM | POA: Diagnosis not present

## 2022-06-28 DIAGNOSIS — E782 Mixed hyperlipidemia: Secondary | ICD-10-CM

## 2022-06-28 NOTE — Progress Notes (Signed)
Cardiology Office Note:    Date:  06/29/2022   ID:  Carolyn Kirk, DOB 12/05/1965, MRN [id number]  PCP:  Shon Haleimberlake, Kathryn S, MD  Cardiologist:  Thomasene RippleKardie Yara Tomkinson, DO  Electrophysiologist:  None   Referring MD: Shon Haleimberlake, Kathryn S, *   " I am really sleep even when I get enough sleep"  History of Present Illness:    Carolyn LoanBibha Guida is a 56 y.o. female with a hx of hyperlipidemia, prediabetes, hypertension, intermittent pauses on ZIO monitor recently.  She also had had a heart catheterization in 2017 which showed no evidence of coronary artery disease.  At her initial visit with me on June 22, 2021 she is experiencing chest discomfort so I sent the patient for coronary CTA, she also has had longstanding sinus bradycardia not on any AV nodal blockers with symptoms and we will place a monitor to understand to make sure she is not experiencing sinus node disease.  Her monitor showed intermittent pauses at night and her coronary CTA did not show any evidence of coronary artery disease.  Since I saw the patient she has follow up with EP, and the discussion was to monitor her nocturnal intermittent pauses.  She tells me that she decided to come to get evaluated because recently she has been experiencing some daytime somnolence.  She is concerned that she goes to bed have long like sleep but still wakes up very tired.  In could fall asleep within an hour.  This is new.  Past Medical History:  Diagnosis Date   Depressive disorder, not elsewhere classified    Low vitamin B12 level    Onychomycosis    of great toenail   Other and unspecified hyperlipidemia    Pain in joint, pelvic region and thigh    Torticollis, unspecified     Past Surgical History:  Procedure Laterality Date   APPENDECTOMY     BREAST LUMPECTOMY Left    CARDIAC CATHETERIZATION N/A 10/21/2015   Procedure: Left Heart Cath and Coronary Angiography;  Surgeon: Lyn RecordsHenry W Smith, MD;  [facility];  Service:  Cardiovascular;  Laterality: N/A;    Current Medications: Current Meds  Medication Sig   atorvastatin (LIPITOR) 40 MG tablet Take 1 tablet (40 mg total) by mouth daily.   FLUoxetine (PROZAC) 20 MG tablet Take 20 mg by mouth every other day.   Multiple Vitamins-Minerals (ONE-A-DAY WOMENS 50 PLUS) TABS Take 1 tablet by mouth daily in the afternoon.   sertraline (ZOLOFT) 50 MG tablet Take 50 mg by mouth every other day.     Allergies:   Patient has no known allergies.   Social History   Socioeconomic History   Marital status: Married    Spouse name: Not on file   Number of children: Not on file   Years of education: Not on file   Highest education level: Not on file  Occupational History   Not on file  Tobacco Use   Smoking status: Never   Smokeless tobacco: Never  Substance and Sexual Activity   Alcohol use: No   Drug use: No   Sexual activity: Yes  Other Topics Concern   Not on file  Social History Narrative   Not on file   Social Determinants of Health   Financial Resource Strain: Not on file  Food Insecurity: Not on file  Transportation Needs: Not on file  Physical Activity: Not on file  Stress: Not on file  Social Connections: Not on file  Family History: The patient's family history includes CVA in her mother; Diabetes in her father; Diabetes Mellitus II in her sister; Esophageal cancer in her father; Hyperlipidemia in her brother; Testicular cancer in her father; Thyroid disease in her sister.  ROS:   Review of Systems  Constitution: Negative for decreased appetite, fever and weight gain.  HENT: Negative for congestion, ear discharge, hoarse voice and sore throat.   Eyes: Negative for discharge, redness, vision loss in right eye and visual halos.  Cardiovascular: Negative for chest pain, dyspnea on exertion, leg swelling, orthopnea and palpitations.  Respiratory: Negative for cough, hemoptysis, shortness of breath and snoring.   Endocrine: Negative for  heat intolerance and polyphagia.  Hematologic/Lymphatic: Negative for bleeding problem. Does not bruise/bleed easily.  Skin: Negative for flushing, nail changes, rash and suspicious lesions.  Musculoskeletal: Negative for arthritis, joint pain, muscle cramps, myalgias, neck pain and stiffness.  Gastrointestinal: Negative for abdominal pain, bowel incontinence, diarrhea and excessive appetite.  Genitourinary: Negative for decreased libido, genital sores and incomplete emptying.  Neurological: Negative for brief paralysis, focal weakness, headaches and loss of balance.  Psychiatric/Behavioral: Negative for altered mental status, depression and suicidal ideas.  Allergic/Immunologic: Negative for HIV exposure and persistent infections.    EKGs/Labs/Other Studies Reviewed:    The following studies were reviewed today:   EKG:  The ekg ordered today demonstrates sinus bradycardia with first-degree AV block.  Compared to prior EKG no significant change.  Recent Labs: No results found for requested labs within last 365 days.  Recent Lipid Panel    Component Value Date/Time   CHOL 289 (H) 06/22/2021 0938   CHOL 210 (H) 06/10/2015 1115   TRIG 159 (H) 06/22/2021 0938   TRIG 179 (H) 06/10/2015 1115   HDL 53 06/22/2021 0938   HDL 65 06/10/2015 1115   CHOLHDL 5.5 (H) 06/22/2021 0938   CHOLHDL 3.2 06/10/2015 1115   LDLCALC 206 (H) 06/22/2021 0938   LDLCALC 109 06/10/2015 1115    Physical Exam:    VS:  LMP 08/08/2013 (Approximate)     Wt Readings from Last 3 Encounters:  07/23/21 155 lb 3.2 oz (70.4 kg)  06/22/21 154 lb 6.4 oz (70 kg)  10/19/15 154 lb 3.2 oz (69.9 kg)     GEN: Well nourished, well developed in no acute distress HEENT: Normal NECK: No JVD; No carotid bruits LYMPHATICS: No lymphadenopathy CARDIAC: S1S2 noted,RRR, no murmurs, rubs, gallops RESPIRATORY:  Clear to auscultation without rales, wheezing or rhonchi  ABDOMEN: Soft, non-tender, non-distended, +bowel sounds, no  guarding. EXTREMITIES: No edema, No cyanosis, no clubbing MUSCULOSKELETAL:  No deformity  SKIN: Warm and dry NEUROLOGIC:  Alert and oriented x 3, non-focal PSYCHIATRIC:  Normal affect, good insight  ASSESSMENT:    1. Daytime somnolence   2. Fatigue, unspecified type   3. Mixed hyperlipidemia   4. Sinus pause    PLAN:     Her symptoms are concerning for sleep apnea given the fact that she also has had nocturnal pauses.  With this daytime somnolence I like to have the patient pursue sleep study.  This has been ordered.  In terms of her hyperlipidemia she is on atorvastatin we will continue this medication.   The patient is in agreement with the above plan. The patient left the office in stable condition.  The patient will follow up in   Medication Adjustments/Labs and Tests Ordered: Current medicines are reviewed at length with the patient today.  Concerns regarding medicines are outlined above.  Orders  Placed This Encounter  Procedures   Split night study   No orders of the defined types were placed in this encounter.   Patient Instructions  Medication Instructions:  Your physician recommends that you continue on your current medications as directed. Please refer to the Current Medication list given to you today.  *If you need a refill on your cardiac medications before your next appointment, please call your pharmacy*   Lab Work: NONE If you have labs (blood work) drawn today and your tests are completely normal, you will receive your results only by: MyChart Message (if you have MyChart) OR A paper copy in the mail If you have any lab test that is abnormal or we need to change your treatment, we will call you to review the results.   Testing/Procedures: Your physician has recommended that you have a sleep study. This test records several body functions during sleep, including: brain activity, eye movement, oxygen and carbon dioxide blood levels, heart rate and  rhythm, breathing rate and rhythm, the flow of air through your mouth and nose, snoring, body muscle movements, and chest and belly movement.    Follow-Up: At [facility], you and your health needs are our priority.  As part of our continuing mission to provide you with exceptional heart care, we have created designated Provider Care Teams.  These Care Teams include your primary Cardiologist (physician) and Advanced Practice Providers (APPs -  Physician Assistants and Nurse Practitioners) who all work together to provide you with the care you need, when you need it.  We recommend signing up for the patient portal called "MyChart".  Sign up information is provided on this After Visit Summary.  MyChart is used to connect with patients for Virtual Visits (Telemedicine).  Patients are able to view lab/test results, encounter notes, upcoming appointments, etc.  Non-urgent messages can be sent to your provider as well.   To learn more about what you can do with MyChart, go to [email or web address].    Your next appointment:   4 month(s)  The format for your next appointment:   Virtual Visit   Provider:   Thomasene Ripple, DO     Adopting a Healthy Lifestyle.  Know what a healthy weight is for you (roughly BMI <25) and aim to maintain this   Aim for 7+ servings of fruits and vegetables daily   65-80+ fluid ounces of water or unsweet tea for healthy kidneys   Limit to max 1 drink of alcohol per day; avoid smoking/tobacco   Limit animal fats in diet for cholesterol and heart health - choose grass fed whenever available   Avoid highly processed foods, and foods high in saturated/trans fats   Aim for low stress - take time to unwind and care for your mental health   Aim for 150 min of moderate intensity exercise weekly for heart health, and weights twice weekly for bone health   Aim for 7-9 hours of sleep daily   When it comes to diets, agreement about the perfect plan isnt easy to  find, even among the experts. Experts at the [facility] [employer] developed an idea known as the Healthy Eating Plate. Just imagine a plate divided into logical, healthy portions.   The emphasis is on diet quality:   Load up on vegetables and fruits - one-half of your plate: Aim for color and variety, and remember that potatoes dont count.   Go for whole grains - one-quarter of your plate: Whole  wheat, barley, wheat berries, quinoa, oats, brown rice, and foods made with them. If you want pasta, go with whole wheat pasta.   Protein power - one-quarter of your plate: Fish, chicken, beans, and nuts are all healthy, versatile protein sources. Limit red meat.   The diet, however, does go beyond the plate, offering a few other suggestions.   Use healthy plant oils, such as olive, canola, soy, corn, sunflower and peanut. Check the labels, and avoid partially hydrogenated oil, which have unhealthy trans fats.   If youre thirsty, drink water. Coffee and tea are good in moderation, but skip sugary drinks and limit milk and dairy products to one or two daily servings.   The type of carbohydrate in the diet is more important than the amount. Some sources of carbohydrates, such as vegetables, fruits, whole grains, and beans-are healthier than others.   Finally, stay active  Signed, Thomasene Ripple, DO  06/29/2022 8:37 AM    [facility]

## 2022-06-28 NOTE — Patient Instructions (Signed)
Medication Instructions:  Your physician recommends that you continue on your current medications as directed. Please refer to the Current Medication list given to you today.  *If you need a refill on your cardiac medications before your next appointment, please call your pharmacy*   Lab Work: NONE If you have labs (blood work) drawn today and your tests are completely normal, you will receive your results only by: MyChart Message (if you have MyChart) OR A paper copy in the mail If you have any lab test that is abnormal or we need to change your treatment, we will call you to review the results.   Testing/Procedures: Your physician has recommended that you have a sleep study. This test records several body functions during sleep, including: brain activity, eye movement, oxygen and carbon dioxide blood levels, heart rate and rhythm, breathing rate and rhythm, the flow of air through your mouth and nose, snoring, body muscle movements, and chest and belly movement.    Follow-Up: At [facility], you and your health needs are our priority.  As part of our continuing mission to provide you with exceptional heart care, we have created designated Provider Care Teams.  These Care Teams include your primary Cardiologist (physician) and Advanced Practice Providers (APPs -  Physician Assistants and Nurse Practitioners) who all work together to provide you with the care you need, when you need it.  We recommend signing up for the patient portal called "MyChart".  Sign up information is provided on this After Visit Summary.  MyChart is used to connect with patients for Virtual Visits (Telemedicine).  Patients are able to view lab/test results, encounter notes, upcoming appointments, etc.  Non-urgent messages can be sent to your provider as well.   To learn more about what you can do with MyChart, go to [email or web address].    Your next appointment:   4 month(s)  The format for your next  appointment:   Virtual Visit   Provider:   Thomasene Ripple, DO

## 2022-06-29 NOTE — Addendum Note (Signed)
Addended by: Derenda Fennel on: 06/29/2022 08:41 AM   Modules accepted: Orders

## 2022-07-12 ENCOUNTER — Encounter: Payer: Self-pay | Admitting: Cardiology

## 2022-08-23 ENCOUNTER — Telehealth: Payer: Self-pay | Admitting: *Deleted

## 2022-08-23 ENCOUNTER — Other Ambulatory Visit: Payer: Self-pay | Admitting: Cardiology

## 2022-08-23 DIAGNOSIS — R5383 Other fatigue: Secondary | ICD-10-CM

## 2022-08-23 DIAGNOSIS — R400 Somnolence: Secondary | ICD-10-CM

## 2022-08-23 NOTE — Telephone Encounter (Signed)
Received a call from [facility] denying request for in lab split night sleep study. Order canceled and HST ordered.

## 2022-09-16 ENCOUNTER — Ambulatory Visit (HOSPITAL_BASED_OUTPATIENT_CLINIC_OR_DEPARTMENT_OTHER): Payer: 59 | Attending: Cardiology | Admitting: Cardiovascular Disease

## 2022-09-16 DIAGNOSIS — R5383 Other fatigue: Secondary | ICD-10-CM

## 2022-09-16 DIAGNOSIS — G4733 Obstructive sleep apnea (adult) (pediatric): Secondary | ICD-10-CM | POA: Diagnosis not present

## 2022-09-16 DIAGNOSIS — R400 Somnolence: Secondary | ICD-10-CM | POA: Diagnosis present

## 2022-09-27 ENCOUNTER — Encounter (HOSPITAL_BASED_OUTPATIENT_CLINIC_OR_DEPARTMENT_OTHER): Payer: Self-pay | Admitting: Cardiovascular Disease

## 2022-09-27 NOTE — Procedures (Signed)
      Patient Name: Carolyn Kirk, Carolyn Kirk Date: 09/16/2022 Gender: Female D.O.B: 1966/08/06 Age (years): 79 Referring Provider: Godfrey Pick Tobb DO Height (inches): 63 Interpreting Physician: Shelva Majestic MD, ABSM Weight (lbs): 150 RPSGT: Jacolyn Reedy BMI: 27 MRN: [id number] Neck Size: 15.00  CLINICAL INFORMATION Sleep Study Type: HST  Indication for sleep study: snoring, nocturnal pauses, fatigue  Epworth Sleepiness Score: 3  SLEEP STUDY TECHNIQUE A multi-channel overnight portable sleep study was performed. The channels recorded were: nasal airflow, thoracic respiratory movement, and oxygen saturation with a pulse oximetry. Snoring was also monitored.  MEDICATIONS atorvastatin (LIPITOR) 40 MG tablet fluconazole (DIFLUCAN) 150 MG tablet FLUoxetine (PROZAC) 20 MG tablet Multiple Vitamins-Minerals (ONE-A-DAY WOMENS 50 PLUS) TABS nitroGLYCERIN (NITROSTAT) 0.4 MG SL tablet ofloxacin (OCUFLOX) 0.3 % ophthalmic solution sertraline (ZOLOFT) 50 MG tablet Patient self administered medications include: N/A.  SLEEP ARCHITECTURE Patient was studied for 408 minutes. The sleep efficiency was 100.0 % and the patient was supine for 0%. The arousal index was 0.0 per hour.  RESPIRATORY PARAMETERS The overall AHI was 10.9 per hour, with a central apnea index of 0 per hour.  The oxygen nadir was 91% during sleep.  CARDIAC DATA Mean heart rate during sleep was 54.4 bpm. Heart rate range: 44 - 97 bpm.  IMPRESSIONS - Mild obstructive sleep apnea occurred during this study (AHI 10.9/h). - The patient had minimal or no oxygen desaturation during the study (Min O2 91%) - Patient snored for 13.2 minutes (3.2%) during the sleep.  DIAGNOSIS - Obstructive Sleep Apnea (G47.33)  RECOMMENDATIONS - Therapeutic CPAP titration to determine optimal pressure required to alleviate sleep disordered breathing. Can initiate a trial of Auto-PAP with EPR of 3 at 6 - 16 cm of water.  - Effort should  be made to optimize nasal and oophagyneal patency. - If patient is agaist CPAP initiation can consider alternative therapy with a customized oral appliance. - Avoid alcohol, sedatives and other CNS depressants that may worsen sleep apnea and disrupt normal sleep architecture. - Sleep hygiene should be reviewed to assess factors that may improve sleep quality. - Weight management and regular exercise should be initiated or continued. - Recommend a download after one month of therapy and sleep clinic evaluation after 4 weeks after the initiation of CPAP or to discuss the results of this study.   [Electronically signed] 09/27/2022 01:12 PM  Shelva Majestic MD, [facility], [place], American Board of Sleep Medicine  NPI: [id number]  [place] PH: [phone]   FX: [phone] [place]

## 2022-10-27 ENCOUNTER — Telehealth: Payer: Self-pay | Admitting: *Deleted

## 2022-10-27 ENCOUNTER — Encounter: Payer: Self-pay | Admitting: Cardiology

## 2022-10-27 ENCOUNTER — Ambulatory Visit: Payer: BC Managed Care – PPO | Attending: Cardiology | Admitting: Cardiology

## 2022-10-27 VITALS — Ht 63.0 in | Wt 149.0 lb

## 2022-10-27 DIAGNOSIS — G4733 Obstructive sleep apnea (adult) (pediatric): Secondary | ICD-10-CM

## 2022-10-27 DIAGNOSIS — R001 Bradycardia, unspecified: Secondary | ICD-10-CM

## 2022-10-27 DIAGNOSIS — E782 Mixed hyperlipidemia: Secondary | ICD-10-CM

## 2022-10-27 DIAGNOSIS — R7303 Prediabetes: Secondary | ICD-10-CM | POA: Diagnosis not present

## 2022-10-27 NOTE — Patient Instructions (Signed)
Medication Instructions:  Your physician recommends that you continue on your current medications as directed. Please refer to the Current Medication list given to you today.  *If you need a refill on your cardiac medications before your next appointment, please call your pharmacy*   Lab Work: None   Testing/Procedures: None   Follow-Up: At [facility], you and your health needs are our priority.  As part of our continuing mission to provide you with exceptional heart care, we have created designated Provider Care Teams.  These Care Teams include your primary Cardiologist (physician) and Advanced Practice Providers (APPs -  Physician Assistants and Nurse Practitioners) who all work together to provide you with the care you need, when you need it.  Your next appointment:   1 year(s)  Provider:   Berniece Salines, DO     Other Instructions A  message has been sent to the sleep study team to have your test results addressed. They will reach out to you.

## 2022-10-27 NOTE — Telephone Encounter (Signed)
Patient notified of sleep study results and recommendations. All questions were answered. Patient plans to look into a oral device and ask me to send her via mychart what she should be asking her dentist.

## 2022-10-30 NOTE — Progress Notes (Signed)
Virtual Visit via Video Note   Because of Marvelous Strnad's co-morbid illnesses, she is at least at moderate risk for complications without adequate follow up.  This format is felt to be most appropriate for this patient at this time.  All issues noted in this document were discussed and addressed.  A limited physical exam was performed with this format.  Please refer to the patient's chart for her consent to telehealth for [facility].      Date:  10/30/2022   ID:  Carolyn Kirk, DOB Mar 13, 1966, MRN [id number]  Patient Location: Home Provider Location: Office/Clinic  PCP:  Glenis Smoker, MD  Cardiologist:  Berniece Salines, DO  Electrophysiologist:  None   Evaluation Performed:  Follow-Up Visit  Chief Complaint:  " I am ok"  History of Present Illness:    Lorell Ibsen is a 57 y.o. female with  hx of hyperlipidemia, prediabetes, hypertension, intermittent pauses on ZIO monitor recently.  She also had had a heart catheterization in 2017 which showed no evidence of coronary artery disease and recent CCTA also negative with no CAD.   At her visit in 11/20203 she was experiencing daytime somnolence and fatigue I recommended a sleep study. Sleep study showed that she has Mild OSA. She is pending discussion with the sleep team and cpap titration She tells me that she has not heard from the sleep team.   She has some questions which is appropriate discussion with our sleep specialist.  The patient does not have symptoms concerning for COVID-19 infection (fever, chills, cough, or new shortness of breath).    Past Medical History:  Diagnosis Date   Depressive disorder, not elsewhere classified    Low vitamin B12 level    Onychomycosis    of great toenail   Other and unspecified hyperlipidemia    Pain in joint, pelvic region and thigh    Torticollis, unspecified    Past Surgical History:  Procedure Laterality Date   APPENDECTOMY     BREAST LUMPECTOMY Left    CARDIAC  CATHETERIZATION N/A 10/21/2015   Procedure: Left Heart Cath and Coronary Angiography;  Surgeon: Belva Crome, MD;  Location: [place] [facility];  Service: Cardiovascular;  Laterality: N/A;     Current Meds  Medication Sig   atorvastatin (LIPITOR) 40 MG tablet Take 1 tablet (40 mg total) by mouth daily.     Allergies:   Patient has no known allergies.   Social History   Tobacco Use   Smoking status: Never   Smokeless tobacco: Never  Substance Use Topics   Alcohol use: No   Drug use: No     Family Hx: The patient's family history includes CVA in her mother; Diabetes in her father; Diabetes Mellitus II in her sister; Esophageal cancer in her father; Hyperlipidemia in her brother; Testicular cancer in her father; Thyroid disease in her sister.  ROS:   Please see the history of present illness.     All other systems reviewed and are negative.   Prior CV studies:   The following studies were reviewed today: Zio monitor 07/2021 Patch Wear Time:  13 days and 23 hours starting 06-29-2021.  Indication: Bradycardia   Patient had a minimum HR of 21 bpm, maximum HR of 144 bpm, and average HR of 64 bpm.   Predominant underlying rhythm was Sinus Rhythm. First Degree AV Block was present.    95 Supraventricular Tachycardia runs occurred, the run with the fastest interval lasting 4 beats with  a max rate of 144 bpm, the longest lasting 10 beats with an avg rate of 96 bpm. Some episodes of Supraventricular Tachycardia may be possible Atrial Tachycardia with variable block.    11 Pauses occurred, the longest lasting 4 secs (15 bpm). Pause(s) occurred due to Possible High Grade AV Block. 9 episode(s) of AV Block (High Grade) occurred, lasting a total of 29 secs.   Isolated SVEs were rare (<1.0%), SVE Couplets were rare (<1.0%), and SVE Triplets were rare (<1.0%). Isolated VEs were rare (<1.0%), and no VE Couplets or VE Triplets were present.    Symptoms were associated with high-grade AV  block, sinus rhythm as well as type I second-degree AV block.   This study is remarkable for the following:                       1.  Symptomatic high-grade AV block                       2.  Sinus pauses, with the longest lasting pause for second                       3.  Paroxysmal supraventricular tachycardia.   Recommendation: I spoke to the patient about the findings of her monitor.  I recommend that she see EP for evaluation for possible pacemaker implantation.  She is not on any AV nodal blockers.  CCTA 06/2021 Noise artifact is: Limited.   Coronary Arteries:  Normal coronary origin.  Right dominance.   Left main: The left main is a large caliber vessel with a normal take off from the left coronary cusp that bifurcates to form a left anterior descending artery and a left circumflex artery. There is no plaque or stenosis.   Left anterior descending artery: The LAD is patent without evidence of plaque or stenosis. The LAD gives off 2 patent diagonal branches.   Left circumflex artery: The LCX is non-dominant and patent with no evidence of plaque or stenosis. The LCX gives off 2 patent obtuse marginal branches.   Right coronary artery: The RCA is dominant with normal take off from the right coronary cusp. There is no evidence of plaque or stenosis. The RCA terminates as a PDA and right posterolateral branch without evidence of plaque or stenosis.   Right Atrium: Right atrial size is within normal limits.   Right Ventricle: The right ventricular cavity is within normal limits.   Left Atrium: Left atrial size is normal in size with no left atrial appendage filling defect.   Left Ventricle: The ventricular cavity size is within normal limits. There are no stigmata of prior infarction. There is no abnormal filling defect.   Pulmonary arteries: Normal in size without proximal filling defect.   Pulmonary veins: Normal pulmonary venous drainage.   Pericardium: Normal  thickness with no significant effusion or calcium present.   Cardiac valves: The aortic valve is trileaflet without significant calcification. The mitral valve is normal structure without significant calcification.   Aorta: Normal caliber with no significant disease.   Extra-cardiac findings: See attached radiology report for non-cardiac structures.   IMPRESSION: 1. Coronary calcium score of 0.   2. Normal coronary origin with right dominance.   3. Normal coronary arteries.   RECOMMENDATIONS: 1. No evidence of CAD (0%). Consider non-atherosclerotic causes of chest pain.   Eleonore Chiquito, MD     Electronically Signed   By: Lake Bells  O'Neal M.D.   On: 06/28/2021 12:27   Labs/Other Tests and Data Reviewed:    EKG:  none today     Recent Labs: No results found for requested labs within last 365 days.   Recent Lipid Panel Lab Results  Component Value Date/Time   CHOL 289 (H) 06/22/2021 09:38 AM   CHOL 210 (H) 06/10/2015 11:15 AM   TRIG 159 (H) 06/22/2021 09:38 AM   TRIG 179 (H) 06/10/2015 11:15 AM   HDL 53 06/22/2021 09:38 AM   HDL 65 06/10/2015 11:15 AM   CHOLHDL 5.5 (H) 06/22/2021 09:38 AM   CHOLHDL 3.2 06/10/2015 11:15 AM   LDLCALC 206 (H) 06/22/2021 09:38 AM   LDLCALC 109 06/10/2015 11:15 AM    Wt Readings from Last 3 Encounters:  10/27/22 149 lb (67.6 kg)  07/23/21 155 lb 3.2 oz (70.4 kg)  06/22/21 154 lb 6.4 oz (70 kg)     Objective:    Vital Signs:  Ht 5\' 3"  (1.6 m)   Wt 149 lb (67.6 kg)   LMP 08/08/2013 (Approximate)   BMI 26.39 kg/m      ASSESSMENT & PLAN:    Mild OSA Hyperlipidemia Sinus pause  She needs to be set up for her cpap titration - I have connected with our sleep team navigation .  No syncope reported.  Continue her statin.  COVID-19 Education: The signs and symptoms of COVID-19 were discussed with the patient and how to seek care for testing (follow up with PCP or arrange E-visit).  The importance of social distancing was  discussed today.  Time:   Today, I have spent 15 minutes with the patient with telehealth technology discussing the above problems.     Medication Adjustments/Labs and Tests Ordered: Current medicines are reviewed at length with the patient today.  Concerns regarding medicines are outlined above.   Tests Ordered: No orders of the defined types were placed in this encounter.   Medication Changes: No orders of the defined types were placed in this encounter.   Follow Up:  In Person in 1 year(s)  Signed, Berniece Salines, DO  10/30/2022 12:24 PM    [facility]

## 2023-03-15 ENCOUNTER — Other Ambulatory Visit (HOSPITAL_COMMUNITY)
Admission: RE | Admit: 2023-03-15 | Discharge: 2023-03-15 | Disposition: A | Discharge status: Home / Self Care | Payer: BLUE CROSS/BLUE SHIELD | Source: Ambulatory Visit | Attending: Obstetrics and Gynecology | Admitting: Obstetrics and Gynecology

## 2023-03-15 ENCOUNTER — Other Ambulatory Visit: Payer: Self-pay | Admitting: Obstetrics and Gynecology

## 2023-03-15 DIAGNOSIS — Z01419 Encounter for gynecological examination (general) (routine) without abnormal findings: Secondary | ICD-10-CM | POA: Insufficient documentation

## 2023-03-16 LAB — CYTOLOGY - PAP
Comment: NEGATIVE
Diagnosis: NEGATIVE
High risk HPV: NEGATIVE

## 2024-01-23 ENCOUNTER — Ambulatory Visit: Admitting: Cardiology

## 2024-02-14 ENCOUNTER — Encounter: Payer: Self-pay | Admitting: *Deleted

## 2024-02-16 ENCOUNTER — Ambulatory Visit: Attending: Cardiology | Admitting: Cardiology

## 2024-02-16 ENCOUNTER — Encounter: Payer: Self-pay | Admitting: Cardiology

## 2024-02-16 VITALS — BP 104/62 | HR 52 | Ht 63.0 in | Wt 155.6 lb

## 2024-02-16 DIAGNOSIS — G4733 Obstructive sleep apnea (adult) (pediatric): Secondary | ICD-10-CM

## 2024-02-16 DIAGNOSIS — I455 Other specified heart block: Secondary | ICD-10-CM | POA: Diagnosis not present

## 2024-02-16 DIAGNOSIS — R001 Bradycardia, unspecified: Secondary | ICD-10-CM | POA: Diagnosis not present

## 2024-02-16 DIAGNOSIS — E782 Mixed hyperlipidemia: Secondary | ICD-10-CM | POA: Diagnosis not present

## 2024-02-16 NOTE — Progress Notes (Unsigned)
 Cardiology Office Note:    Date:  02/17/2024   ID:  Carolyn Kirk, DOB 1966/05/28, MRN [id number]  PCP:  Chrystal Lamarr RAMAN, MD  Cardiologist:  Dub Huntsman, DO  Electrophysiologist:  None   Referring MD: Chrystal Lamarr RAMAN, *    I am doing well  History of Present Illness:    Carolyn Kirk is a 58 y.o. female with a hx of  hyperlipidemia, prediabetes, hypertension, intermittent pauses on ZIO monitor recently but was seen by EO deemed pacemaker not indicated at that time.  She also had had a heart catheterization in 2017 which showed no evidence of coronary artery disease and recent CCTA also negative with no CAD.   Since her last visit with me with me in 10/2022, She has experienced a significant reduction in chest pain since her last visit.    She is currently taking Lipitor, with a recent change in dosage from every other day to daily. Her LDL cholesterol remains slightly elevated. Her hemoglobin A1c has increased to 6.2.  No episodes of syncope or changes in her condition since the last visit. No current chest pain or fatigue  Past Medical History:  Diagnosis Date   Depressive disorder, not elsewhere classified    Low vitamin B12 level    Onychomycosis    of great toenail   Other and unspecified hyperlipidemia    Pain in joint, pelvic region and thigh    Torticollis, unspecified     Past Surgical History:  Procedure Laterality Date   APPENDECTOMY     BREAST LUMPECTOMY Left    CARDIAC CATHETERIZATION N/A 10/21/2015   Procedure: Left Heart Cath and Coronary Angiography;  Surgeon: Victory LELON Sharps, MD;  Location: [facility];  Service: Cardiovascular;  Laterality: N/A;    Current Medications: Current Meds  Medication Sig   atorvastatin  (LIPITOR) 40 MG tablet Take 1 tablet (40 mg total) by mouth daily.   FLUoxetine (PROZAC) 20 MG tablet Take 20 mg by mouth every other day.   Multiple Vitamins-Minerals (ONE-A-DAY WOMENS 50 PLUS) TABS Take 1 tablet by mouth daily  in the afternoon.   nitroGLYCERIN  (NITROSTAT ) 0.4 MG SL tablet Place 1 tablet (0.4 mg total) under the tongue every 5 (five) minutes as needed for chest pain.     Allergies:   Patient has no known allergies.   Social History   Socioeconomic History   Marital status: Married    Spouse name: Not on file   Number of children: Not on file   Years of education: Not on file   Highest education level: Not on file  Occupational History   Not on file  Tobacco Use   Smoking status: Never   Smokeless tobacco: Never  Substance and Sexual Activity   Alcohol use: No   Drug use: No   Sexual activity: Yes  Other Topics Concern   Not on file  Social History Narrative   Not on file   Social Drivers of Health   Financial Resource Strain: Not on file  Food Insecurity: Not on file  Transportation Needs: Not on file  Physical Activity: Not on file  Stress: Not on file  Social Connections: Unknown (12/19/2021)   Received from [employer]   Social Network    Social Network: Not on file     Family History: The patient's family history includes CVA in her mother; Diabetes in her father; Diabetes Mellitus II in her sister; Esophageal cancer in her father; Hyperlipidemia in her brother; Testicular  cancer in her father; Thyroid  disease in her sister.  ROS:   Review of Systems  Constitution: Negative for decreased appetite, fever and weight gain.  HENT: Negative for congestion, ear discharge, hoarse voice and sore throat.   Eyes: Negative for discharge, redness, vision loss in right eye and visual halos.  Cardiovascular: Negative for chest pain, dyspnea on exertion, leg swelling, orthopnea and palpitations.  Respiratory: Negative for cough, hemoptysis, shortness of breath and snoring.   Endocrine: Negative for heat intolerance and polyphagia.  Hematologic/Lymphatic: Negative for bleeding problem. Does not bruise/bleed easily.  Skin: Negative for flushing, nail changes, rash and suspicious  lesions.  Musculoskeletal: Negative for arthritis, joint pain, muscle cramps, myalgias, neck pain and stiffness.  Gastrointestinal: Negative for abdominal pain, bowel incontinence, diarrhea and excessive appetite.  Genitourinary: Negative for decreased libido, genital sores and incomplete emptying.  Neurological: Negative for brief paralysis, focal weakness, headaches and loss of balance.  Psychiatric/Behavioral: Negative for altered mental status, depression and suicidal ideas.  Allergic/Immunologic: Negative for HIV exposure and persistent infections.    EKGs/Labs/Other Studies Reviewed:    The following studies were reviewed today:   EKG:  The ekg ordered today demonstrates   Recent Labs: No results found for requested labs within last 365 days.  Recent Lipid Panel    Component Value Date/Time   CHOL 289 (H) 06/22/2021 0938   CHOL 210 (H) 06/10/2015 1115   TRIG 159 (H) 06/22/2021 0938   TRIG 179 (H) 06/10/2015 1115   HDL 53 06/22/2021 0938   HDL 65 06/10/2015 1115   CHOLHDL 5.5 (H) 06/22/2021 0938   CHOLHDL 3.2 06/10/2015 1115   LDLCALC 206 (H) 06/22/2021 0938   LDLCALC 109 06/10/2015 1115    Physical Exam:    VS:  BP 104/62 (BP Location: Left Arm, Patient Position: Sitting, Cuff Size: Normal)   Pulse (!) 52   Ht 5' 3 (1.6 m)   Wt 155 lb 9.6 oz (70.6 kg)   LMP 08/08/2013 (Approximate)   SpO2 99%   BMI 27.56 kg/m     Wt Readings from Last 3 Encounters:  02/16/24 155 lb 9.6 oz (70.6 kg)  10/27/22 149 lb (67.6 kg)  07/23/21 155 lb 3.2 oz (70.4 kg)     GEN: Well nourished, well developed in no acute distress HEENT: Normal NECK: No JVD; No carotid bruits LYMPHATICS: No lymphadenopathy CARDIAC: S1S2 noted,RRR, no murmurs, rubs, gallops RESPIRATORY:  Clear to auscultation without rales, wheezing or rhonchi  ABDOMEN: Soft, non-tender, non-distended, +bowel sounds, no guarding. EXTREMITIES: No edema, No cyanosis, no clubbing MUSCULOSKELETAL:  No deformity  SKIN:  Warm and dry NEUROLOGIC:  Alert and oriented x 3, non-focal PSYCHIATRIC:  Normal affect, good insight  ASSESSMENT:    1. Sinus bradycardia   2. OSA (obstructive sleep apnea)   3. Mixed hyperlipidemia   4. Sinus pause    PLAN:    Hx of sinus pause, EKG today with sinus bradycardia - Previous heart rate monitoring unremarkable. - Recommend CardiaMobile for home EKG if symptoms arise.  Hyperlipidemia LDL slightly elevated. On Lipitor 40 mg daily. - Continue Lipitor 40 mg daily. - schedule for well visit with her pcp soon - lipid profile will be done at that time  Prediabetes -  Hemoglobin A1c at 6.2, indicating prediabetes. No medication prescribed for prediabetes.   Mild OSA - not on CPAP   The patient is in agreement with the above plan. The patient left the office in stable condition.  The patient will follow  up in   Medication Adjustments/Labs and Tests Ordered: Current medicines are reviewed at length with the patient today.  Concerns regarding medicines are outlined above.  Orders Placed This Encounter  Procedures   EKG 12-Lead   No orders of the defined types were placed in this encounter.   Patient Instructions  Medication Instructions:  Your physician recommends that you continue on your current medications as directed. Please refer to the Current Medication list given to you today.  *If you need a refill on your cardiac medications before your next appointment, please call your pharmacy*   Follow-Up: At [facility], you and your health needs are our priority.  As part of our continuing mission to provide you with exceptional heart care, our providers are all part of one team.  This team includes your primary Cardiologist (physician) and Advanced Practice Providers or APPs (Physician Assistants and Nurse Practitioners) who all work together to provide you with the care you need, when you need it.  Your next appointment:   1 year(s) via  MyChart  Provider:   Tully Burgo, DO   Other Instructions KardiaMobile [email or web address]        FDA-cleared, clinical grade mobile EKG monitor: Crist is the most clinically-validated mobile EKG used by the world's leading cardiac care medical professionals With Basic service, know instantly if your heart rhythm is normal or if atrial fibrillation is detected, and email the last single EKG recording to yourself or your doctor Premium service, available for purchase through the Kardia app for $9.99 per month or $99 per year, includes unlimited history and storage of your EKG recordings, a monthly EKG summary report to share with your doctor, along with the ability to track your blood pressure, activity and weight Includes one KardiaMobile phone clip FREE SHIPPING: Standard delivery 1-3 business days. Orders placed by 11:00am PST will ship that afternoon. Otherwise, will ship next business day. All orders ship via [employer] from [place], [place]    [employer] - sending an EKG Download app and set up profile. Run EKG - by placing 1-2 fingers on the silver plates After EKG is complete - Download PDF  - Skip password (if you apply a password the provider will need it to view the EKG) Click share button (square with upward arrow) in bottom left corner To send: choose MyChart (first time log into MyChart)  Pop up window about sending ECG Click continue Choose type of message Choose provider Type subject and message Click send (EKG should be attached)  - To send additional EKGs in one message click the paperclip image and bottom of page to attach.      Adopting a Healthy Lifestyle.  Know what a healthy weight is for you (roughly BMI <25) and aim to maintain this   Aim for 7+ servings of fruits and vegetables daily   65-80+ fluid ounces of water or unsweet tea for healthy kidneys   Limit to max 1 drink of alcohol per day; avoid smoking/tobacco    Limit animal fats in diet for cholesterol and heart health - choose grass fed whenever available   Avoid highly processed foods, and foods high in saturated/trans fats   Aim for low stress - take time to unwind and care for your mental health   Aim for 150 min of moderate intensity exercise weekly for heart health, and weights twice weekly for bone health   Aim for 7-9 hours of sleep daily   When it comes to  diets, agreement about the perfect plan isnt easy to find, even among the experts. Experts at the [facility] [employer] developed an idea known as the Healthy Eating Plate. Just imagine a plate divided into logical, healthy portions.   The emphasis is on diet quality:   Load up on vegetables and fruits - one-half of your plate: Aim for color and variety, and remember that potatoes dont count.   Go for whole grains - one-quarter of your plate: Whole wheat, barley, wheat berries, quinoa, oats, brown rice, and foods made with them. If you want pasta, go with whole wheat pasta.   Protein power - one-quarter of your plate: Fish, chicken, beans, and nuts are all healthy, versatile protein sources. Limit red meat.   The diet, however, does go beyond the plate, offering a few other suggestions.   Use healthy plant oils, such as olive, canola, soy, corn, sunflower and peanut. Check the labels, and avoid partially hydrogenated oil, which have unhealthy trans fats.   If youre thirsty, drink water. Coffee and tea are good in moderation, but skip sugary drinks and limit milk and dairy products to one or two daily servings.   The type of carbohydrate in the diet is more important than the amount. Some sources of carbohydrates, such as vegetables, fruits, whole grains, and beans-are healthier than others.   Finally, stay active  Signed, Dub Huntsman, DO  02/17/2024 9:10 AM    [facility]

## 2024-02-16 NOTE — Patient Instructions (Signed)
 Medication Instructions:  Your physician recommends that you continue on your current medications as directed. Please refer to the Current Medication list given to you today.  *If you need a refill on your cardiac medications before your next appointment, please call your pharmacy*   Follow-Up: At [facility], you and your health needs are our priority.  As part of our continuing mission to provide you with exceptional heart care, our providers are all part of one team.  This team includes your primary Cardiologist (physician) and Advanced Practice Providers or APPs (Physician Assistants and Nurse Practitioners) who all work together to provide you with the care you need, when you need it.  Your next appointment:   1 year(s) via MyChart  Provider:   Kardie Tobb, DO   Other Instructions KardiaMobile [email or web address]        FDA-cleared, clinical grade mobile EKG monitor: Crist is the most clinically-validated mobile EKG used by the world's leading cardiac care medical professionals With Basic service, know instantly if your heart rhythm is normal or if atrial fibrillation is detected, and email the last single EKG recording to yourself or your doctor Premium service, available for purchase through the K[employer]ia app for $9.99 per month or $99 per year, includes unlimited history and storage of your EKG recordings, a monthly EKG summary report to share with your doctor, along with the ability to track your blood pressure, activity and weight Includes one KardiaMobile phone clip FREE SHIPPING: Standard delivery 1-3 business days. Orders placed by 11:00am PST will ship that afternoon. Otherwise, will ship next business day. All orders ship via [employer] from [place], [place]    [employer] - sending an EKG Download app and set up profile. Run EKG - by placing 1-2 fingers on the silver plates After EKG is complete - Download PDF  - Skip password  (if you apply a password the provider will need it to view the EKG) Click share button (square with upward arrow) in bottom left corner To send: choose MyChart (first time log into MyChart)  Pop up window about sending ECG Click continue Choose type of message Choose provider Type subject and message Click send (EKG should be attached)  - To send additional EKGs in one message click the paperclip image and bottom of page to attach.
